# Patient Record
Sex: Female | Born: 1985 | Hispanic: Yes | Smoking: Never smoker
Health system: Southern US, Community
[De-identification: ages and names within clinical notes are randomized; demographics above are authoritative.]

## PROBLEM LIST (undated history)

## (undated) DIAGNOSIS — Z789 Other specified health status: Secondary | ICD-10-CM

## (undated) HISTORY — PX: NO PAST SURGERIES: SHX2092

---

## 2018-03-02 LAB — OB RESULTS CONSOLE ABO/RH: RH Type: POSITIVE

## 2018-03-02 LAB — OB RESULTS CONSOLE RUBELLA ANTIBODY, IGM: Rubella: IMMUNE

## 2018-03-02 LAB — OB RESULTS CONSOLE HEPATITIS B SURFACE ANTIGEN: Hepatitis B Surface Ag: NEGATIVE

## 2018-03-02 LAB — OB RESULTS CONSOLE GC/CHLAMYDIA
Chlamydia: NEGATIVE
GC PROBE AMP, GENITAL: NEGATIVE

## 2018-03-02 LAB — OB RESULTS CONSOLE ANTIBODY SCREEN: Antibody Screen: NEGATIVE

## 2018-03-02 LAB — OB RESULTS CONSOLE HIV ANTIBODY (ROUTINE TESTING): HIV: NONREACTIVE

## 2018-03-02 LAB — OB RESULTS CONSOLE RPR: RPR: NONREACTIVE

## 2018-03-11 ENCOUNTER — Other Ambulatory Visit (HOSPITAL_COMMUNITY): Payer: Self-pay | Admitting: Nurse Practitioner

## 2018-03-11 DIAGNOSIS — Z369 Encounter for antenatal screening, unspecified: Secondary | ICD-10-CM

## 2018-03-13 ENCOUNTER — Encounter (HOSPITAL_COMMUNITY): Payer: Self-pay

## 2018-03-19 ENCOUNTER — Encounter (HOSPITAL_COMMUNITY): Payer: Self-pay | Admitting: *Deleted

## 2018-03-20 ENCOUNTER — Other Ambulatory Visit (HOSPITAL_COMMUNITY): Payer: Self-pay | Admitting: *Deleted

## 2018-03-20 ENCOUNTER — Ambulatory Visit (HOSPITAL_COMMUNITY)
Admission: RE | Admit: 2018-03-20 | Discharge: 2018-03-20 | Disposition: A | Discharge status: Home / Self Care | Payer: 59 | Source: Ambulatory Visit | Attending: Nurse Practitioner | Admitting: Nurse Practitioner

## 2018-03-20 ENCOUNTER — Ambulatory Visit (HOSPITAL_COMMUNITY): Admission: RE | Admit: 2018-03-20 | Payer: 59 | Source: Ambulatory Visit

## 2018-03-20 ENCOUNTER — Encounter (HOSPITAL_COMMUNITY): Payer: Self-pay

## 2018-03-20 ENCOUNTER — Other Ambulatory Visit (HOSPITAL_COMMUNITY): Payer: Self-pay | Admitting: Nurse Practitioner

## 2018-03-20 DIAGNOSIS — Z369 Encounter for antenatal screening, unspecified: Secondary | ICD-10-CM

## 2018-03-20 DIAGNOSIS — Z363 Encounter for antenatal screening for malformations: Secondary | ICD-10-CM | POA: Diagnosis not present

## 2018-03-20 DIAGNOSIS — Z362 Encounter for other antenatal screening follow-up: Secondary | ICD-10-CM

## 2018-03-20 DIAGNOSIS — Z3A14 14 weeks gestation of pregnancy: Secondary | ICD-10-CM | POA: Insufficient documentation

## 2018-03-20 DIAGNOSIS — Z3682 Encounter for antenatal screening for nuchal translucency: Secondary | ICD-10-CM | POA: Diagnosis present

## 2018-03-20 HISTORY — DX: Other specified health status: Z78.9

## 2018-05-01 ENCOUNTER — Encounter (HOSPITAL_COMMUNITY): Payer: Self-pay

## 2018-05-01 ENCOUNTER — Ambulatory Visit (HOSPITAL_COMMUNITY): Payer: Medicaid Other

## 2018-08-21 LAB — OB RESULTS CONSOLE GBS: STREP GROUP B AG: NEGATIVE

## 2018-08-21 LAB — OB RESULTS CONSOLE GC/CHLAMYDIA
CHLAMYDIA, DNA PROBE: NEGATIVE
GC PROBE AMP, GENITAL: NEGATIVE

## 2018-09-09 NOTE — L&D Delivery Note (Addendum)
Patient: Leah Molina MRN: [id number]  GBS status: GBS negative   Patient is a 33 y.o. now [id number] s/p NSVD at [redacted]w[redacted]d, who was admitted for IOL for post dates. AROM 1h 2m prior to delivery with clear fluid.    Delivery Note At 3:41 AM a viable female was delivered via Vaginal, Spontaneous (Presentation:LOA ;).  APGAR:8/9; weight pending   .   Placenta status: intact, spontaneous, .  Cord: 3 vessel   Anesthesia:  Epidural  Episiotomy: None Lacerations:  None  Suture Repair: n/a Est. Blood Loss (mL):   Mom to postpartum.  Baby to Couplet care / Skin to Skin.  Sigurd Sos Deloglos 09/21/2018, 3:56 AM   Head delivered LOA. No nuchal cord present. Shoulder and body delivered in usual fashion. Infant with spontaneous cry, placed on mother's abdomen, dried and bulb suctioned. Cord clamped x 2 after 1-minute delay, and cut by family member. Cord blood drawn. Placenta delivered spontaneously with gentle cord traction. Fundus firm with massage and Pitocin. Perineum inspected and found to be intact.   Stephenia Deloglos   OB FELLOW DELIVERY ATTESTATION  I was gloved and present for the delivery in its entirety, and I agree with the above resident's note.    Gwenevere Abbot, MD  OB Fellow  09/21/2018, 04:28 AM

## 2018-09-14 ENCOUNTER — Telehealth (HOSPITAL_COMMUNITY): Payer: Self-pay | Admitting: *Deleted

## 2018-09-14 ENCOUNTER — Other Ambulatory Visit: Payer: Self-pay | Admitting: Certified Nurse Midwife

## 2018-09-14 NOTE — Telephone Encounter (Signed)
Preadmission screen  

## 2018-09-16 ENCOUNTER — Other Ambulatory Visit: Payer: Self-pay | Admitting: Advanced Practice Midwife

## 2018-09-20 ENCOUNTER — Inpatient Hospital Stay (HOSPITAL_COMMUNITY): Payer: 59 | Admitting: Anesthesiology

## 2018-09-20 ENCOUNTER — Inpatient Hospital Stay (HOSPITAL_COMMUNITY)
Admission: RE | Admit: 2018-09-20 | Discharge: 2018-09-22 | DRG: 807 | Disposition: A | Discharge status: Home / Self Care | Payer: 59 | Attending: Obstetrics & Gynecology | Admitting: Obstetrics & Gynecology

## 2018-09-20 ENCOUNTER — Encounter (HOSPITAL_COMMUNITY): Payer: Self-pay

## 2018-09-20 VITALS — BP 101/67 | HR 75 | Temp 97.8°F | Resp 16 | Ht 67.0 in | Wt 224.6 lb

## 2018-09-20 DIAGNOSIS — O480 Post-term pregnancy: Secondary | ICD-10-CM | POA: Diagnosis present

## 2018-09-20 DIAGNOSIS — O26893 Other specified pregnancy related conditions, third trimester: Secondary | ICD-10-CM | POA: Diagnosis present

## 2018-09-20 DIAGNOSIS — Z6791 Unspecified blood type, Rh negative: Secondary | ICD-10-CM

## 2018-09-20 DIAGNOSIS — Z3A40 40 weeks gestation of pregnancy: Secondary | ICD-10-CM | POA: Diagnosis not present

## 2018-09-20 DIAGNOSIS — Z3A41 41 weeks gestation of pregnancy: Secondary | ICD-10-CM

## 2018-09-20 DIAGNOSIS — Z3490 Encounter for supervision of normal pregnancy, unspecified, unspecified trimester: Secondary | ICD-10-CM | POA: Diagnosis present

## 2018-09-20 LAB — CBC
HCT: 41.7 % (ref 36.0–46.0)
Hemoglobin: 14.1 g/dL (ref 12.0–15.0)
MCH: 32 pg (ref 26.0–34.0)
MCHC: 33.8 g/dL (ref 30.0–36.0)
MCV: 94.8 fL (ref 80.0–100.0)
NRBC: 0 % (ref 0.0–0.2)
Platelets: 162 10*3/uL (ref 150–400)
RBC: 4.4 MIL/uL (ref 3.87–5.11)
RDW: 12.9 % (ref 11.5–15.5)
WBC: 8.2 10*3/uL (ref 4.0–10.5)

## 2018-09-20 LAB — TYPE AND SCREEN
ABO/RH(D): O POS
Antibody Screen: NEGATIVE

## 2018-09-20 LAB — ABO/RH: ABO/RH(D): O POS

## 2018-09-20 MED ORDER — OXYCODONE-ACETAMINOPHEN 5-325 MG PO TABS: 2.0000 | ORAL_TABLET | ORAL | Status: DC | PRN

## 2018-09-20 MED ORDER — FENTANYL CITRATE (PF) 100 MCG/2ML IJ SOLN: 100.0000 ug | INTRAMUSCULAR | Status: DC | PRN

## 2018-09-20 MED ORDER — MISOPROSTOL 50MCG HALF TABLET
50.0000 ug | ORAL_TABLET | ORAL | Status: DC | PRN
  Administered 2018-09-20: 50 ug via ORAL
  Filled 2018-09-20 (×2): qty 1

## 2018-09-20 MED ORDER — MISOPROSTOL 25 MCG QUARTER TABLET
25.0000 ug | ORAL_TABLET | Freq: Once | ORAL | Status: AC
  Administered 2018-09-20: 25 ug via VAGINAL
  Filled 2018-09-20: qty 1

## 2018-09-20 MED ORDER — EPHEDRINE 5 MG/ML INJ
10.0000 mg | INTRAVENOUS | Status: DC | PRN
  Filled 2018-09-20: qty 2

## 2018-09-20 MED ORDER — ZOLPIDEM TARTRATE 5 MG PO TABS: 5.0000 mg | ORAL_TABLET | Freq: Every evening | ORAL | Status: DC | PRN

## 2018-09-20 MED ORDER — LACTATED RINGERS IV SOLN: 500.0000 mL | Freq: Once | INTRAVENOUS | Status: DC

## 2018-09-20 MED ORDER — ACETAMINOPHEN 325 MG PO TABS: 650.0000 mg | ORAL_TABLET | ORAL | Status: DC | PRN

## 2018-09-20 MED ORDER — PHENYLEPHRINE 40 MCG/ML (10ML) SYRINGE FOR IV PUSH (FOR BLOOD PRESSURE SUPPORT)
80.0000 ug | PREFILLED_SYRINGE | INTRAVENOUS | Status: DC | PRN
  Filled 2018-09-20 (×2): qty 10

## 2018-09-20 MED ORDER — LACTATED RINGERS IV SOLN: 500.0000 mL | INTRAVENOUS | Status: DC | PRN

## 2018-09-20 MED ORDER — SOD CITRATE-CITRIC ACID 500-334 MG/5ML PO SOLN: 30.0000 mL | ORAL | Status: DC | PRN

## 2018-09-20 MED ORDER — MISOPROSTOL 25 MCG QUARTER TABLET
25.0000 ug | ORAL_TABLET | ORAL | Status: DC
  Administered 2018-09-20: 25 ug via VAGINAL
  Filled 2018-09-20 (×4): qty 1

## 2018-09-20 MED ORDER — LIDOCAINE HCL (PF) 1 % IJ SOLN
30.0000 mL | INTRAMUSCULAR | Status: DC | PRN
  Filled 2018-09-20: qty 30

## 2018-09-20 MED ORDER — TERBUTALINE SULFATE 1 MG/ML IJ SOLN
0.2500 mg | Freq: Once | INTRAMUSCULAR | Status: DC | PRN
  Filled 2018-09-20: qty 1

## 2018-09-20 MED ORDER — ONDANSETRON HCL 4 MG/2ML IJ SOLN: 4.0000 mg | Freq: Four times a day (QID) | INTRAMUSCULAR | Status: DC | PRN

## 2018-09-20 MED ORDER — FENTANYL 2.5 MCG/ML BUPIVACAINE 1/10 % EPIDURAL INFUSION (WH - ANES)
14.0000 mL/h | INTRAMUSCULAR | Status: DC | PRN
  Administered 2018-09-20: 14 mL/h via EPIDURAL
  Filled 2018-09-20: qty 100

## 2018-09-20 MED ORDER — OXYTOCIN BOLUS FROM INFUSION
500.0000 mL | Freq: Once | INTRAVENOUS | Status: AC
  Administered 2018-09-21: 500 mL via INTRAVENOUS

## 2018-09-20 MED ORDER — LACTATED RINGERS IV SOLN
INTRAVENOUS | Status: DC
  Administered 2018-09-20 – 2018-09-21 (×4): via INTRAVENOUS

## 2018-09-20 MED ORDER — PHENYLEPHRINE 40 MCG/ML (10ML) SYRINGE FOR IV PUSH (FOR BLOOD PRESSURE SUPPORT)
80.0000 ug | PREFILLED_SYRINGE | INTRAVENOUS | Status: DC | PRN
  Filled 2018-09-20: qty 10

## 2018-09-20 MED ORDER — DIPHENHYDRAMINE HCL 50 MG/ML IJ SOLN: 12.5000 mg | INTRAMUSCULAR | Status: DC | PRN

## 2018-09-20 MED ORDER — OXYCODONE-ACETAMINOPHEN 5-325 MG PO TABS: 1.0000 | ORAL_TABLET | ORAL | Status: DC | PRN

## 2018-09-20 MED ORDER — OXYTOCIN 40 UNITS IN NORMAL SALINE INFUSION - SIMPLE MED
2.5000 [IU]/h | INTRAVENOUS | Status: DC
  Filled 2018-09-20: qty 1000

## 2018-09-20 MED ORDER — MISOPROSTOL 50MCG HALF TABLET
50.0000 ug | ORAL_TABLET | ORAL | Status: DC | PRN
  Administered 2018-09-20 (×2): 50 ug via BUCCAL
  Filled 2018-09-20 (×2): qty 1

## 2018-09-20 NOTE — Progress Notes (Signed)
Patient ID: Leah Molina, female   DOB: 30-Dec-1985, 33 y.o.   MRN: [id number] Leah Molina is a 33 y.o. [id number] at 6765w0d.  Subjective: Mild, rare cramping   Objective: Patient Vitals for t 09/20/18 1006 117/67 - - 79 16 - -  09/20/18 0901 (!) 100/59 - - 83 18 - -     FHT:  FHR: 135 bpm, variability: mod,  accelerations:  15x15,  decelerations:  none UC:   Q 1-5 minutes, mild Dilation: 3 Effacement (%): 40 Cervical Position: Middle Station: -3 Presentation: Vertex Exam by:: Candida PeelingKaren Haynes, RN  Labs: Results for orders placed or performed during the hospital encounter of 09/20/18 (from the past 24 hour(s))  CBC     Status: None   Collection Time: 09/20/18 12:37 AM  Result Value Ref Range   WBC 8.2 4.0 - 10.5 K/uL   RBC 4.40 3.87 - 5.11 MIL/uL   Hemoglobin 14.1 12.0 - 15.0 g/dL   HCT 13.041.7 86.536.0 - 78.446.0 %   MCV 94.8 80.0 - 100.0 fL   MCH 32.0 26.0 - 34.0 pg   MCHC 33.8 30.0 - 36.0 g/dL   RDW 69.612.9 29.511.5 - 28.415.5 %   Platelets 162 150 - 400 K/uL   nRBC 0.0 0.0 - 0.2 %  Type and screen     Status: None   Collection Time: 09/20/18 12:37 AM  Result Value Ref Range   ABO/RH(D) O POS    Antibody Screen NEG    Sample Expiration      09/23/2018 Performed at [facility], [address] [place], [place] [address]   ABO/Rh     Status: None   Collection Time: 09/20/18 12:37 AM  Result Value Ref Range   ABO/RH(D)      O POS Performed at [facility], [address] [place], [place] [address]     Assessment / Plan: 2865w0d week IUP Labor: OIL Fetal Wellbeing:  Category I Pain Control:  None Anticipated MOD:  SVD Repeat Cytotec  Katrinka BlazingSmith, [place], CNM 09/20/2018 11:31 AM

## 2018-09-20 NOTE — Anesthesia Pain Management Evaluation Note (Signed)
  CRNA Pain Management Visit Note  Patient: Leah Molina, 33 y.o., female  "Hello I am a member of the anesthesia team at [facility]. We have an anesthesia team available at all times to provide care throughout the hospital, including epidural management and anesthesia for C-section. I don't know your plan for the delivery whether it a natural birth, water birth, IV sedation, nitrous supplementation, doula or epidural, but we want to meet your pain goals."   1.Was your pain managed to your expectations on prior hospitalizations?   Yes   2.What is your expectation for pain management during this hospitalization?     Labor support without medications  3.How can we help you reach that goal? Be available for emergency.  Record the patient's initial score and the patient's pain goal.   Pain: 1  Pain Goal: 10 [facility] wants you to be able to say your pain was always managed very well.  Daneya Hartgrove 09/20/2018

## 2018-09-20 NOTE — H&P (Addendum)
LABOR AND DELIVERY ADMISSION HISTORY AND PHYSICAL NOTE  Leah Molina is a 33 y.o. female [phone] with IUP at [redacted]w[redacted]d by 14w [place] presenting for IOL for postdates.   She reports positive fetal movement. She denies leakage of fluid or vaginal bleeding.  Prenatal History/Complications: [facility] Pregnancy complications:   - None  Past Medical History: Past Medical History:  Diagnosis Date  . Medical history non-contributory     Past Surgical History: Past Surgical History:  Procedure Laterality Date  . NO PAST SURGERIES      Obstetrical History: OB History    Gravida  4   Para  2   Term  2   Preterm  0   AB  1   Living  2     SAB      TAB  1   Ectopic      Multiple      Live Births              Social History: Social History   Socioeconomic History  . Marital status: Significant Other    Spouse name: CJ  . Number of children: 2  . Years of education: Not on file  . Highest education level: Not on file  Occupational History  . Not on file  Social Needs  . Financial resource strain: Not on file  . Food insecurity:    Worry: Not on file    Inability: Not on file  . Transportation needs:    Medical: Not on file    Non-medical: Not on file  Tobacco Use  . Smoking status: Never Smoker  . Smokeless tobacco: Never Used  Substance and Sexual Activity  . Alcohol use: Never    Frequency: Never  . Drug use: Never  . Sexual activity: Yes  Lifestyle  . Physical activity:    Days per week: Not on file    Minutes per session: Not on file  . Stress: Not on file  Relationships  . Social connections:    Talks on phone: Not on file    Gets together: Not on file    Attends religious service: Not on file    Active member of club or organization: Not on file    Attends meetings of clubs or organizations: Not on file    Relationship status: Not on file  Other Topics Concern  . Not on file  Social History Narrative  . Not on file     Family History: No family history on file.  Allergies: No Known Allergies  Medications Prior to Admission  Medication Sig Dispense Refill Last Dose  . Prenatal Vit-Fe Fumarate-FA (PRENATAL VITAMIN PO) Take by mouth.   Taking     Review of Systems  All systems reviewed and negative except as stated in HPI  Physical Exam Blood pressure (!) 107/59, pulse 81, temperature 98 F (36.7 C), temperature source Oral, resp. rate 16, height 5\' 7"  (1.702 m), weight 101.9 kg, last menstrual period 12/16/2017. General appearance: alert, oriented, NAD Lungs: normal respiratory effort Heart: regular rate Abdomen: soft, non-tender; gravid, FH appropriate for GA Extremities: No calf swelling or tenderness Presentation: cephalic Fetal monitoring: Baseline 145, moderate variability, accel 15x15, variable decels Uterine activity:  Dilation: 3 Effacement (%): 40 Station: -3 Exam by:: Manning Charity RN   Prenatal labs: ABO, Rh: --/--/O POS (01/12 0037) Antibody: NEG (01/12 0037) Rubella: Immune (06/24 0000) RPR: Nonreactive (06/24 0000)  HBsAg: Negative (06/24 0000)  HIV: Non-reactive (06/24 0000)  GC/Chlamydia: Negative/Negative GBS: Negative (12/13 0000)  1-hr GTT: wnl at 121 Genetic screening:  negative Anatomy US: wnl, boy  Prenatal Transfer Tool  Maternal Diabetes: No Genetic Screening: Normal Maternal Ultrasounds/Referrals: Normal Fetal Ultrasounds or other Referrals:  None Maternal Substance Abuse:  No Significant Maternal Medications:  None Significant Maternal Lab Results: Lab values include: Group B Strep negative, Rh negative  Results for orders placed or performed during the hospital encounter of 09/20/18 (from the past 24 hour(s))  CBC   Collection Time: 09/20/18 12:37 AM  Result Value Ref Range   WBC 8.2 4.0 - 10.5 K/uL   RBC 4.40 3.87 - 5.11 MIL/uL   Hemoglobin 14.1 12.0 - 15.0 g/dL   HCT 44.0 34.7 - 42.5 %   MCV 94.8 80.0 - 100.0 fL   MCH 32.0 26.0 - 34.0 pg    MCHC 33.8 30.0 - 36.0 g/dL   RDW 95.6 38.7 - 56.4 %   Platelets 162 150 - 400 K/uL   nRBC 0.0 0.0 - 0.2 %  Type and screen   Collection Time: 09/20/18 12:37 AM  Result Value Ref Range   ABO/RH(D) O POS    Antibody Screen NEG    Sample Expiration      09/23/2018 Performed at [facility], [address], [place], [place] [address]     Patient Active Problem List   Diagnosis Date Noted  . Encounter for elective induction of labor 09/20/2018    Assessment: Leah Molina is a 33 y.o. [id number] at [redacted]w[redacted]d here for IOL for postdates.  #Labor: IOL, latent. Cytotec for now. Transition to pitocin when cervix more favorable #Pain: fentanyl #FWB: Category II #ID:  GBS negative #MOF: Breast #MOC:Mirena #Circ:  None desired  Dollene Cleveland 09/20/2018, 5:33 AM  OB FELLOW HISTORY AND PHYSICAL ATTESTATION  I have seen and examined this patient; I agree with above documentation in the resident's note.   Gwenevere Abbot, MD  OB Fellow  09/20/2018, 5:46 AM

## 2018-09-20 NOTE — Progress Notes (Signed)
Patient ID: Leah Molina, female   DOB: 1986-07-13, 33 y.o.   MRN: [id number] Leah Molina is a 33 y.o. [id number] at [redacted]w[redacted]d.  Subjective: Mild, irreg cramping  Objective: BP 118/70   Pulse 83   Temp 98.3 F (36.8 C) (Oral)   Resp 18   Ht 5\' 7"  (1.702 m)   Wt 101.9 kg   LMP 12/16/2017 (Exact Date)   BMI 35.18 kg/m    FHT:  FHR: 140 bpm, variability: mod,  accelerations:  15x15,  decelerations:  none UC:   Q 1.5-4 minutes, mild Dilation: 3 Effacement (%): Thick Cervical Position: Middle Station: -3 Presentation: Vertex Exam by:: Ivonne Andrew, CNM  Labs: NA  Assessment / Plan: [redacted]w[redacted]d week IUP Labor: IOL Fetal Wellbeing:  Category I Pain Control:  None Anticipated MOD:  SVD Repeat Cytotec due to very thick cervix. Baby ballotable so unable to safely AROM.   Katrinka Blazing, [place], [place] 09/20/2018 6:34 PM

## 2018-09-20 NOTE — Progress Notes (Signed)
Leah Molina is a 33 y.o. [phone] at [redacted]w[redacted]d for postdates  Subjective: Doing well, comfortable.  Objective: BP 122/75   Pulse 84   Temp 98.5 F (36.9 C) (Oral)   Resp 16   Ht 5\' 7"  (1.702 m)   Wt 101.9 kg   LMP 12/16/2017 (Exact Date)   BMI 35.18 kg/m  No intake/output data recorded. No intake/output data recorded.  FHT:  FHR: 150 bpm, variability: moderate,  accelerations:  Present,  decelerations:  Absent UC:   irregular, every 2-4 minutes. Inadequate SVE:   Dilation: 3 Effacement (%): 60 Station: -3 Exam by:: Dr. Primitivo Gauze  Labs: Lab Results  Component Value Date   WBC 8.2 09/20/2018   HGB 14.1 09/20/2018   HCT 41.7 09/20/2018   MCV 94.8 09/20/2018   PLT 162 09/20/2018    Assessment / Plan: Cytotec x2. Cook's Foley bulb placed. Once foley bulb out will start pitocin  Labor: some progress in terms of effacement but still 3cm. Placed cook's foley bulb. Will start pitocin when FB out. Preeclampsia:  none Fetal Wellbeing:  Category I Pain Control:  epidural when necessary I/D:  n/a Anticipated MOD:  NSVD  Myrene Buddy 09/20/2018, 9:23 PM

## 2018-09-20 NOTE — Progress Notes (Signed)
Leah Molina, CNM notified pt requesting to shower. Per CNM patient may shower. Patient to notify RN when done.

## 2018-09-21 ENCOUNTER — Encounter (HOSPITAL_COMMUNITY): Payer: Self-pay

## 2018-09-21 DIAGNOSIS — O480 Post-term pregnancy: Secondary | ICD-10-CM

## 2018-09-21 DIAGNOSIS — Z3A40 40 weeks gestation of pregnancy: Secondary | ICD-10-CM

## 2018-09-21 LAB — RPR: RPR Ser Ql: NONREACTIVE

## 2018-09-21 MED ORDER — BENZOCAINE-MENTHOL 20-0.5 % EX AERO: 1.0000 "application " | INHALATION_SPRAY | CUTANEOUS | Status: DC | PRN

## 2018-09-21 MED ORDER — DIBUCAINE 1 % RE OINT
1.0000 "application " | TOPICAL_OINTMENT | RECTAL | Status: DC | PRN
  Administered 2018-09-21: 1 via RECTAL
  Filled 2018-09-21: qty 28

## 2018-09-21 MED ORDER — DIPHENHYDRAMINE HCL 25 MG PO CAPS: 25.0000 mg | ORAL_CAPSULE | Freq: Four times a day (QID) | ORAL | Status: DC | PRN

## 2018-09-21 MED ORDER — SENNOSIDES-DOCUSATE SODIUM 8.6-50 MG PO TABS
2.0000 | ORAL_TABLET | ORAL | Status: DC
  Administered 2018-09-21: 2 via ORAL
  Filled 2018-09-21: qty 2

## 2018-09-21 MED ORDER — COCONUT OIL OIL: 1.0000 "application " | TOPICAL_OIL | Status: DC | PRN

## 2018-09-21 MED ORDER — SIMETHICONE 80 MG PO CHEW: 80.0000 mg | CHEWABLE_TABLET | ORAL | Status: DC | PRN

## 2018-09-21 MED ORDER — IBUPROFEN 600 MG PO TABS
600.0000 mg | ORAL_TABLET | Freq: Four times a day (QID) | ORAL | Status: DC
  Administered 2018-09-21 – 2018-09-22 (×5): 600 mg via ORAL
  Filled 2018-09-21 (×5): qty 1

## 2018-09-21 MED ORDER — ONDANSETRON HCL 4 MG PO TABS: 4.0000 mg | ORAL_TABLET | ORAL | Status: DC | PRN

## 2018-09-21 MED ORDER — ONDANSETRON HCL 4 MG/2ML IJ SOLN: 4.0000 mg | INTRAMUSCULAR | Status: DC | PRN

## 2018-09-21 MED ORDER — LACTATED RINGERS IV SOLN: 500.0000 mL | Freq: Once | INTRAVENOUS | Status: DC

## 2018-09-21 MED ORDER — PRENATAL MULTIVITAMIN CH
1.0000 | ORAL_TABLET | Freq: Every day | ORAL | Status: DC
  Administered 2018-09-21: 1 via ORAL
  Filled 2018-09-21: qty 1

## 2018-09-21 MED ORDER — ACETAMINOPHEN 325 MG PO TABS: 650.0000 mg | ORAL_TABLET | ORAL | Status: DC | PRN

## 2018-09-21 MED ORDER — TETANUS-DIPHTH-ACELL PERTUSSIS 5-2.5-18.5 LF-MCG/0.5 IM SUSP: 0.5000 mL | Freq: Once | INTRAMUSCULAR | Status: DC

## 2018-09-21 MED ORDER — WITCH HAZEL-GLYCERIN EX PADS
1.0000 "application " | MEDICATED_PAD | CUTANEOUS | Status: DC | PRN
  Administered 2018-09-21: 1 via TOPICAL

## 2018-09-21 MED ORDER — LIDOCAINE HCL (PF) 1 % IJ SOLN
INTRAMUSCULAR | Status: DC | PRN
  Administered 2018-09-20 (×2): 4 mL via EPIDURAL

## 2018-09-21 MED ORDER — ZOLPIDEM TARTRATE 5 MG PO TABS: 5.0000 mg | ORAL_TABLET | Freq: Every evening | ORAL | Status: DC | PRN

## 2018-09-21 NOTE — Discharge Summary (Signed)
Postpartum Discharge Summary     Patient Name: Leah Molina DOB: Dec 29, 1985 MRN: [id number]  Date of admission: 09/20/2018 Delivering Provider: Gwenevere Abbot   Date of discharge: 09/22/2018  Admitting diagnosis: 40 WK INDUCTION for post dates Intrauterine pregnancy: [redacted]w[redacted]d     Secondary diagnosis:  Active Problems:   Encounter for elective induction of labor  Additional problems: none     Discharge diagnosis: Term Pregnancy Delivered                                                                                                Post partum procedures:none  Augmentation: cytotec, cervical foley,   Complications: None  Hospital course:  Induction of Labor With Vaginal Delivery   33 y.o. yo [id number] at [redacted]w[redacted]d was admitted to the hospital 09/20/2018 for induction of labor.  Indication for induction: Postdates.  Patient had an uncomplicated labor course as follows: Membrane Rupture Time/Date: 2:37 AM ,09/21/2018   Intrapartum Procedures: Episiotomy: None [1]                                         Lacerations:  None [1]  Patient had delivery of a Viable infant.  Information for the patient's newborn:  Kandice, Eberly [[id number]]  Delivery Method: Vaginal, Spontaneous(Filed from Delivery Summary)   09/21/2018  Details of delivery can be found in separate delivery note.  Patient had a routine postpartum course. Patient is discharged home 09/22/18.  Magnesium Sulfate recieved: No BMZ received: No  Physical exam  Vitals:   09/21/18 1026 09/21/18 1404 09/21/18 2158 09/22/18 0610  BP: (!) 93/59 (!) 104/59 108/64 101/67  Pulse: 87 89 93 75  Resp: 20 18  16   Temp: 99.1 F (37.3 C) 98.1 F (36.7 C) 98.6 F (37 C) 97.8 F (36.6 C)  TempSrc:   Oral Oral  SpO2:   95% 98%  Weight:      Height:       General: alert Lochia: appropriate Uterine Fundus: firm Incision: n/a DVT Evaluation: No evidence of DVT seen on physical exam. Labs: Lab Results  Component Value Date    WBC 8.2 09/20/2018   HGB 14.1 09/20/2018   HCT 41.7 09/20/2018   MCV 94.8 09/20/2018   PLT 162 09/20/2018   No flowsheet data found.  Discharge instruction: per After Visit Summary and "Baby and Me Booklet".  After visit meds:  Allergies as of 09/22/2018   No Known Allergies     Medication List    STOP taking these medications   calcium carbonate 500 MG chewable tablet Commonly known as:  TUMS - dosed in mg elemental calcium     TAKE these medications   ibuprofen 600 MG tablet Commonly known as:  ADVIL,MOTRIN Take 1 tablet (600 mg total) by mouth every 6 (six) hours.   PRENATAL VITAMIN PO Take 1 tablet by mouth daily.       Diet: routine diet  Activity: Advance as tolerated. Pelvic rest for 6 weeks.  Outpatient follow up:4 weeks Follow up Appt:No future appointments. Follow up Visit:   Please schedule this patient for Postpartum visit in: 4 weeks with the following provider: Any provider For C/S patients schedule nurse incision check in weeks 2 weeks: no Low risk pregnancy complicated by: n/a Delivery mode:  SVD Anticipated Birth Control:  IUD PP Procedures needed: n/a  Schedule Integrated BH visit: no      Newborn Data: Live born female  Birth Weight:   APGAR: 8, 8  Newborn Delivery   Birth date/time:  09/21/2018 03:41:00 Delivery type:  Vaginal, Spontaneous     Baby Feeding: Breast Disposition:home with mother   09/22/2018 Jacklyn Shell, CNM

## 2018-09-21 NOTE — Anesthesia Postprocedure Evaluation (Signed)
Anesthesia Post Note  Patient: Leah Molina  Procedure(s) Performed: AN AD HOC LABOR EPIDURAL     Patient location during evaluation: Mother Baby Anesthesia Type: Epidural Level of consciousness: awake and alert and oriented Pain management: satisfactory to patient Vital Signs Assessment: post-procedure vital signs reviewed and stable Respiratory status: respiratory function stable Cardiovascular status: stable Postop Assessment: no headache, no backache, epidural receding, patient able to bend at knees, no signs of nausea or vomiting and adequate PO intake Anesthetic complications: no    Last Vitals:  Vitals:   09/21/18 0521 09/21/18 0614  BP: (!) 103/57 132/60  Pulse: 73 67  Resp: 19 18  Temp: 37.1 C 37.1 C  SpO2: 97% 95%    Last Pain:  Vitals:   09/21/18 0300  TempSrc: Oral  PainSc:    Pain Goal:                 [name]

## 2018-09-21 NOTE — Anesthesia Procedure Notes (Signed)
Epidural Patient location during procedure: OB Start time: 09/20/2018 11:38 PM End time: 09/20/2018 11:41 PM  Staffing Anesthesiologist: Kaylyn Layer, MD Performed: anesthesiologist   Preanesthetic Checklist Completed: patient identified, pre-op evaluation, timeout performed, IV checked, risks and benefits discussed and monitors and equipment checked  Epidural Patient position: sitting Prep: site prepped and draped and DuraPrep Patient monitoring: continuous pulse ox, blood pressure, heart rate and cardiac monitor Approach: midline Location: L3-L4 Injection technique: LOR air  Needle:  Needle type: Tuohy  Needle gauge: 17 G Needle length: 9 cm Needle insertion depth: 6 cm Catheter type: closed end flexible Catheter size: 19 Gauge Catheter at skin depth: 11 cm Test dose: negative and Other (1% lidocaine)  Assessment Events: blood not aspirated, injection not painful, no injection resistance, negative IV test and no paresthesia  Additional Notes Patient identified. Risks, benefits, and alternatives discussed with patient including but not limited to bleeding, infection, nerve damage, paralysis, failed block, incomplete pain control, headache, blood pressure changes, nausea, vomiting, reactions to medication, itching, and postpartum back pain. Confirmed with bedside nurse the patient's most recent platelet count. Confirmed with patient that they are not currently taking any anticoagulation, have any bleeding history, or any family history of bleeding disorders. Patient expressed understanding and wished to proceed. All questions were answered. Sterile technique was used throughout the entire procedure. Crisp LOR after one needle redirection. Please see nursing notes for vital signs. Test dose was given through epidural catheter and negative prior to continuing to dose epidural or start infusion. Warning signs of high block given to the patient including shortness of breath,  tingling/numbness in hands, complete motor block, or any concerning symptoms with instructions to call for help. Patient was given instructions on fall risk and not to get out of bed. All questions and concerns addressed with instructions to call with any issues or inadequate analgesia.  Reason for block:procedure for pain

## 2018-09-21 NOTE — Progress Notes (Addendum)
LABOR PROGRESS NOTE  Leah Molina is a 33 y.o. [phone] at [redacted]w[redacted]d  admitted for IOL for post dates.   Subjective: Doing well, comfortable s/p epidural   Objective: BP 134/72   Pulse 82   Temp 97.8 F (36.6 C) (Axillary)   Resp 16   Ht 5\' 7"  (1.702 m)   Wt 101.9 kg   LMP 12/16/2017 (Exact Date)   SpO2 100%   BMI 35.18 kg/m  or  Vitals:   09/21/18 0030 09/21/18 0100 09/21/18 0130 09/21/18 0200  BP: 116/70 115/72 122/68 134/72  Pulse: 68 79 71 82  Resp:  18  16  Temp:      TempSrc:      SpO2: 98% 97% 99% 100%  Weight:      Height:        0245 SVE: Dilation: 7 Effacement (%): 60 Cervical Position: Middle Station: 0 Presentation: Vertex Exam by:: Steph   AROM 0237: clear, small fluid  FHT: baseline rate 135, moderate varibility, no acel, variable/early decel Toco: every 2 minutes, lasting 50-60 seconds  Labs: Lab Results  Component Value Date   WBC 8.2 09/20/2018   HGB 14.1 09/20/2018   HCT 41.7 09/20/2018   MCV 94.8 09/20/2018   PLT 162 09/20/2018    Patient Active Problem List   Diagnosis Date Noted  . Encounter for elective induction of labor 09/20/2018    Assessment / Plan: 33 y.o. [id number] at 102w1d here for IOL for post dates  GBS negative  Labor: progressing as expected, discussed risks/benefits of AROM, AROM'd patient to facilitate delivery.   AROM: clear fluid, small amount, malodorous  Fetal Wellbeing:  Category 2, continue position changes, IV bolus started Pain Control:  Epidural  Anticipated MOD:  SVD  Stephenia Deloglos, SNM  09/21/2018, 2:44 AM   OB FELLOW LABOR PROGRESS NOTE ATTESTATION  I have seen and examined this patient and agree with above documentation in the resident's note.   Gwenevere Abbot, MD  OB Fellow  09/21/2018, 3:29 AM

## 2018-09-21 NOTE — Anesthesia Preprocedure Evaluation (Signed)
Anesthesia Evaluation  Patient identified by MRN, date of birth, ID band Patient awake    Reviewed: Allergy & Precautions, Patient's Chart, lab work & pertinent test results  History of Anesthesia Complications Negative for: history of anesthetic complications  Airway Mallampati: II  TM Distance: >3 FB Neck ROM: Full    Dental  (+) Teeth Intact   Pulmonary neg pulmonary ROS,    Pulmonary exam normal breath sounds clear to auscultation       Cardiovascular negative cardio ROS Normal cardiovascular exam Rhythm:Regular Rate:Normal     Neuro/Psych negative neurological ROS  negative psych ROS   GI/Hepatic negative GI ROS, Neg liver ROS,   Endo/Other  negative endocrine ROS  Renal/GU negative Renal ROS  negative genitourinary   Musculoskeletal negative musculoskeletal ROS (+)   Abdominal   Peds negative pediatric ROS (+)  Hematology negative hematology ROS (+)   Anesthesia Other Findings   Reproductive/Obstetrics (+) Pregnancy                             Anesthesia Physical Anesthesia Plan  ASA: II  Anesthesia Plan: Epidural   Post-op Pain Management:    Induction:   PONV Risk Score and Plan: Treatment may vary due to age or medical condition  Airway Management Planned: Natural Airway  Additional Equipment:   Intra-op Plan:   Post-operative Plan:   Informed Consent: I have reviewed the patients History and Physical, chart, labs and discussed the procedure including the risks, benefits and alternatives for the proposed anesthesia with the patient or authorized representative who has indicated his/her understanding and acceptance.       Plan Discussed with: CRNA  Anesthesia Plan Comments:         Anesthesia Quick Evaluation  

## 2018-09-22 ENCOUNTER — Encounter (HOSPITAL_COMMUNITY): Payer: Self-pay

## 2018-09-22 MED ORDER — IBUPROFEN 600 MG PO TABS: 600.0000 mg | ORAL_TABLET | Freq: Four times a day (QID) | ORAL | 0 refills | Status: DC

## 2018-09-22 NOTE — Lactation Note (Signed)
This note was copied from a baby's chart. Lactation Consultation Note  Patient Name: Leah Molina DGLOV'F Date: 09/22/2018 Reason for consult: Initial assessment;Term P3, 22 hour female infant. Per parents, infant has one void and one stool since delivery. Per mom, active on [facility] program in [place]. Per mom, she doesn't have a breast pump at home, [facility] gave mom harmony hand pump explained how to clean, assemble and re-assemble breast pump.  Per mom, she breastfeed and formula feed her previous son for 4 months but stopped due to returning to work.  LC entered room mom was towards the end of breastfeeding infant, per mom infant had been breastfeeding for 25 minutes. Mom latched infant on right breast using the  cross cradle hold, swallows observed infant deep latch.  Mom demonstrated hand expression and infant was given 2 ml of EBM on spoon. LC discussed cluster feeding with parents. Mom knows to breastfeed infant according hunger cues and not exceed 3 hours without breastfeeding infant. LC discussed I&O. Reviewed Baby & Me book's Breastfeeding Basics. Mom made aware of O/P services, breastfeeding support groups, community resources, and our phone # for post-discharge questions.     Maternal Data Formula Feeding for Exclusion: No Has patient been taught Hand Expression?: Yes(Infant given 9ml of colostrum by spoon.) Does the patient have breastfeeding experience prior to this delivery?: Yes  Feeding Feeding Type: Breast Fed  LATCH Score Latch: Grasps breast easily, tongue down, lips flanged, rhythmical sucking.  Audible Swallowing: Spontaneous and intermittent  Type of Nipple: Everted at rest and after stimulation  Comfort (Breast/Nipple): Soft / non-tender  Hold (Positioning): No assistance needed to correctly position infant at breast.  LATCH Score: 10  Interventions Interventions: Breast feeding basics reviewed;Hand express;Breast compression;Hand pump  Lactation  Tools Discussed/Used WIC Program: Yes Pump Review: Setup, frequency, and cleaning;Milk Storage Initiated by:: Danelle Earthly, IBCLC Date initiated:: 09/22/18   Consult Status Consult Status: Follow-up Date: 09/23/18 Follow-up type: In-patient    Danelle Earthly 09/22/2018, 1:48 AM

## 2018-09-22 NOTE — Lactation Note (Signed)
This note was copied from a baby's chart. Lactation Consultation Note  Patient Name: Boy Taquasha Torney LKGMW'N Date: 09/22/2018 Reason for consult: Follow-up assessment Mom reports that baby is latching with ease and she is comfortable.  Discussed milk coming to volume and the prevention and treatment of engorgement.  She has a manual pump for home use.  Lactation outpatient services and support information reviewed and encouraged.  Maternal Data    Feeding Feeding Type: Breast Fed  LATCH Score Latch: Grasps breast easily, tongue down, lips flanged, rhythmical sucking.  Audible Swallowing: Spontaneous and intermittent  Type of Nipple: Everted at rest and after stimulation  Comfort (Breast/Nipple): Soft / non-tender  Hold (Positioning): Assistance needed to correctly position infant at breast and maintain latch.  LATCH Score: 9  Interventions    Lactation Tools Discussed/Used     Consult Status Consult Status: Complete Follow-up type: Call as needed    Huston Foley 09/22/2018, 10:34 AM

## 2018-09-22 NOTE — Discharge Instructions (Signed)

## 2019-09-10 NOTE — L&D Delivery Note (Signed)
OB/GYN Faculty Practice Delivery Note  Leah Molina is a 34 y.o. [id number] s/p SVDat [redacted]w[redacted]d. She was admitted for IOL for PD.   ROM: rupture date, rupture time, delivery date, or delivery time have not been documented with clear fluid GBS Status: Negative/-- (09/30 1533) Maximum Maternal Temperature: n/a  Labor Progress: . Initial SVE: 2/50/-2. She received cytotec x1 and then was transitioned to pitocin. SROM at 9cm. She then progressed to complete.   Delivery Date/Time: 10/31 at 1920  Delivery: Called to room and patient was complete and pushing.  Slow crown noted. Head delivered LOA. Shoulder dystocia identified. Patient placed in [place], suprapubic pressure applied. Rotational maneuvers attempted without movement. Posterior shoulder (left) removed and successfully relieved dystocia.  Loose nuchal cord present. Body delivered in usual fashion. Infant without spontaneous cry, cord clamped and cut by MD and baby handed over to neonatal team. Total shoulder dystocia time 60 seconds. Cord blood drawn. Placenta delivered spontaneously with gentle cord traction. Fundus firm with massage and Pitocin. Labia, perineum, vagina, and cervix inspected inspected with no lacerations. After neonatal team inspection of newborn, she was able to be handed back to mother. Mom and baby stable.    Baby Weight: pending  Placenta: Sent to L&D Complications: sixty second shoulder dystocia Lacerations: none EBL: 100 mL Analgesia: Epidural   Infant:  APGAR (1 MIN):  Not documented at time of note writing  APGAR (5 MINS): 9   APGAR (10 MINS):     Casper Harrison, MD [facility] Family Medicine Fellow, [facility] for [facility], [facility] 07/09/2020, 7:49 PM

## 2019-12-14 ENCOUNTER — Encounter: Payer: Self-pay | Admitting: General Practice

## 2019-12-27 ENCOUNTER — Other Ambulatory Visit: Payer: Self-pay

## 2019-12-27 ENCOUNTER — Ambulatory Visit (INDEPENDENT_AMBULATORY_CARE_PROVIDER_SITE_OTHER): Payer: Medicaid Other | Admitting: *Deleted

## 2019-12-27 VITALS — BP 109/72 | HR 79 | Temp 98.1°F | Wt 212.4 lb

## 2019-12-27 DIAGNOSIS — Z3480 Encounter for supervision of other normal pregnancy, unspecified trimester: Secondary | ICD-10-CM

## 2019-12-27 MED ORDER — VITAFOL GUMMIES 3.33-0.333-34.8 MG PO CHEW: 3.0000 | CHEWABLE_TABLET | Freq: Every day | ORAL | 12 refills | Status: DC

## 2019-12-27 MED ORDER — GOJJI WEIGHT SCALE MISC: 1.0000 | Freq: Every day | 0 refills | Status: DC | PRN

## 2019-12-27 MED ORDER — BLOOD PRESSURE MONITOR AUTOMAT DEVI: 1.0000 | Freq: Every day | 0 refills | Status: DC

## 2019-12-27 NOTE — Patient Instructions (Addendum)
 Genetic Screening Results Information: You are having genetic testing called [facility] today.  It will take approximately 2 weeks before the results are available.  To get your results, you need Internet access to a web browser to search [facility]/MyChart (the direct app on your phone will not give you these results).  Then select Lab Scanned and click on the blue hyper link that says View Image to see your Panorama results.  You can also use the directions on the purple card given to look up your results directly on the [employer] website.   Second Trimester of Pregnancy  The second trimester is from week 14 through week 27 (month 4 through 6). This is often the time in pregnancy that you feel your best. Often times, morning sickness has lessened or quit. You may have more energy, and you may get hungry more often. Your unborn baby is growing rapidly. At the end of the sixth month, he or she is about 9 inches long and weighs about 1 pounds. You will likely feel the baby move between 18 and 20 weeks of pregnancy. Follow these instructions at home: Medicines  Take over-the-counter and prescription medicines only as told by your doctor. Some medicines are safe and some medicines are not safe during pregnancy.  Take a prenatal vitamin that contains at least 600 micrograms (mcg) of folic acid.  If you have trouble pooping (constipation), take medicine that will make your stool soft (stool softener) if your doctor approves. Eating and drinking   Eat regular, healthy meals.  Avoid raw meat and uncooked cheese.  If you get low calcium from the food you eat, talk to your doctor about taking a daily calcium supplement.  Avoid foods that are high in fat and sugars, such as fried and sweet foods.  If you feel sick to your stomach (nauseous) or throw up (vomit): ? Eat 4 or 5 small meals a day instead of 3 large meals. ? Try eating a few soda crackers. ? Drink liquids between meals instead of during  meals.  To prevent constipation: ? Eat foods that are high in fiber, like fresh fruits and vegetables, whole grains, and beans. ? Drink enough fluids to keep your pee (urine) clear or pale yellow. Activity  Exercise only as told by your doctor. Stop exercising if you start to have cramps.  Do not exercise if it is too hot, too humid, or if you are in a place of great height (high altitude).  Avoid heavy lifting.  Wear low-heeled shoes. Sit and stand up straight.  You can continue to have sex unless your doctor tells you not to. Relieving pain and discomfort  Wear a good support bra if your breasts are tender.  Take warm water baths (sitz baths) to soothe pain or discomfort caused by hemorrhoids. Use hemorrhoid cream if your doctor approves.  Rest with your legs raised if you have leg cramps or low back pain.  If you develop puffy, bulging veins (varicose veins) in your legs: ? Wear support hose or compression stockings as told by your doctor. ? Raise (elevate) your feet for 15 minutes, 3-4 times a day. ? Limit salt in your food. Prenatal care  Write down your questions. Take them to your prenatal visits.  Keep all your prenatal visits as told by your doctor. This is important. Safety  Wear your seat belt when driving.  Make a list of emergency phone numbers, including numbers for family, friends, the hospital, and police and   fire departments. General instructions  Ask your doctor about the right foods to eat or for help finding a counselor, if you need these services.  Ask your doctor about local prenatal classes. Begin classes before month 6 of your pregnancy.  Do not use hot tubs, steam rooms, or saunas.  Do not douche or use tampons or scented sanitary pads.  Do not cross your legs for long periods of time.  Visit your dentist if you have not done so. Use a soft toothbrush to brush your teeth. Floss gently.  Avoid all smoking, herbs, and alcohol. Avoid drugs  that are not approved by your doctor.  Do not use any products that contain nicotine or tobacco, such as cigarettes and e-cigarettes. If you need help quitting, ask your doctor.  Avoid cat litter boxes and soil used by cats. These carry germs that can cause birth defects in the baby and can cause a loss of your baby (miscarriage) or stillbirth. Contact a doctor if:  You have mild cramps or pressure in your lower belly.  You have pain when you pee (urinate).  You have bad smelling fluid coming from your vagina.  You continue to feel sick to your stomach (nauseous), throw up (vomit), or have watery poop (diarrhea).  You have a nagging pain in your belly area.  You feel dizzy. Get help right away if:  You have a fever.  You are leaking fluid from your vagina.  You have spotting or bleeding from your vagina.  You have severe belly cramping or pain.  You lose or gain weight rapidly.  You have trouble catching your breath and have chest pain.  You notice sudden or extreme puffiness (swelling) of your face, hands, ankles, feet, or legs.  You have not felt the baby move in over an hour.  You have severe headaches that do not go away when you take medicine.  You have trouble seeing. Summary  The second trimester is from week 14 through week 27 (months 4 through 6). This is often the time in pregnancy that you feel your best.  To take care of yourself and your unborn baby, you will need to eat healthy meals, take medicines only if your doctor tells you to do so, and do activities that are safe for you and your baby.  Call your doctor if you get sick or if you notice anything unusual about your pregnancy. Also, call your doctor if you need help with the right food to eat, or if you want to know what activities are safe for you. This information is not intended to replace advice given to you by your health care provider. Make sure you discuss any questions you have with your health  care provider. Document Revised: 12/18/2018 Document Reviewed: 10/01/2016 Elsevier Patient Education  2020 Elsevier Inc.  Warning Signs During Pregnancy A pregnancy lasts about 40 weeks, starting from the first day of your last period until the baby is born. Pregnancy is divided into three phases called trimesters.  The first trimester refers to week 1 through week 13 of pregnancy.  The second trimester is the start of week 14 through the end of week 27.  The third trimester is the start of week 28 until you deliver your baby. During each trimester of pregnancy, certain signs and symptoms may indicate a problem. Talk with your health care provider about your current health and any medical conditions you have. Make sure you know the symptoms that you should watch   for and report. How does this affect me?  Warning signs in the first trimester While some changes during the first trimester may be uncomfortable, most do not represent a serious problem. Let your health care provider know if you have any of the following warning signs in the first trimester:  You cannot eat or drink without vomiting, and this lasts for longer than a day.  You have vaginal bleeding or spotting along with menstrual-like cramping.  You have diarrhea for longer than a day.  You have a fever or other signs of infection, such as: ? Pain or burning when you urinate. ? Foul smelling or thick or yellowish vaginal discharge. Warning signs in the second trimester As your baby grows and changes during the second trimester, there are additional signs and symptoms that may indicate a problem. These include:  Signs and symptoms of infection, including a fever.  Signs or symptoms of a miscarriage or preterm labor, such as regular contractions, menstrual-like cramping, or lower abdominal pain.  Bloody or watery vaginal discharge or obvious vaginal bleeding.  Feeling like your heart is pounding.  Having trouble  breathing.  Nausea, vomiting, or diarrhea that lasts for longer than a day.  Craving non-food items, such as clay, chalk, or dirt. This may be a sign of a very treatable medical condition called pica. Later in your second trimester, watch for signs and symptoms of a serious medical condition called preeclampsia.These include:  Changes in your vision.  A severe headache that does not go away.  Nausea and vomiting. It is also important to notice if your baby stops moving or moves less than usual during this time. Warning signs in the third trimester As you approach the third trimester, your baby is growing and your body is preparing for the birth of your baby. In your third trimester, be sure to let your health care provider know if:  You have signs and symptoms of infection, including a fever.  You have vaginal bleeding.  You notice that your baby is moving less than usual or is not moving.  You have nausea, vomiting, or diarrhea that lasts for longer than a day.  You have a severe headache that does not go away.  You have vision changes, including seeing spots or having blurry or double vision.  You have increased swelling in your hands or face. How does this affect my baby? Throughout your pregnancy, always report any of the warning signs of a problem to your health care provider. This can help prevent complications that may affect your baby, including:  Increased risk for premature birth.  Infection that may be transmitted to your baby.  Increased risk for stillbirth. Contact a health care provider if:  You have any of the warning signs of a problem for the current trimester of your pregnancy.  Any of the following apply to you during any trimester of pregnancy: ? You have strong emotions, such as sadness or anxiety, that interfere with work or personal relationships. ? You feel unsafe in your home and need help finding a safe place to live. ? You are using tobacco  products, alcohol, or drugs and you need help to stop. Get help right away if: You have signs or symptoms of labor before 37 weeks of pregnancy. These include:  Contractions that are 5 minutes or less apart, or that increase in frequency, intensity, or length.  Sudden, sharp abdominal pain or low back pain.  Uncontrolled gush or trickle of fluid from   your vagina. Summary  A pregnancy lasts about 40 weeks, starting from the first day of your last period until the baby is born. Pregnancy is divided into three phases called trimesters. Each trimester has warning signs to watch for.  Always report any warning signs to your health care provider in order to prevent complications that may affect both you and your baby.  Talk with your health care provider about your current health and any medical conditions you have. Make sure you know the symptoms that you should watch for and report. This information is not intended to replace advice given to you by your health care provider. Make sure you discuss any questions you have with your health care provider. Document Revised: 12/15/2018 Document Reviewed: 06/12/2017 Elsevier Patient Education  2020 Elsevier Inc.  

## 2019-12-27 NOTE — Progress Notes (Signed)
   PRENATAL INTAKE SUMMARY  [name] presents today New OB Nurse Interview.  OB History    Gravida  5   Para  3   Term  3   Preterm  0   AB  1   Living  3     SAB      TAB  1   Ectopic      Multiple  0   Live Births  3          I have reviewed the patient's medical, obstetrical, social, and family histories, medications, and available lab results.  SUBJECTIVE She has no unusual complaints.  OBJECTIVE Initial nurse interview for history and labs (New OB)  EDD: 07/02/20 by LMP GA: [redacted]w[redacted]d G5P3013 FHT: 160  GENERAL APPEARANCE: alert, well appearing, in no apparent distress, oriented to person, place and time, well hydrated   ASSESSMENT Normal pregnancy  PLAN Prenatal care-[facility]/HIV  OB Urine Culture GC/CT at next visit with Leah Molina, CNM 01/26/20 HgbEval/SMA/CF ([facility]) [facility] Rx for Prenatal gummies sent to Pharmacy Rx for BP cuff and weight scale sent to [facility] Patient to sign up for Babyscripts  Clovis Pu, RN

## 2019-12-28 ENCOUNTER — Encounter: Payer: Self-pay | Admitting: General Practice

## 2019-12-28 LAB — OBSTETRIC PANEL, INCLUDING HIV
Antibody Screen: NEGATIVE
Basophils Absolute: 0 10*3/uL (ref 0.0–0.2)
Basos: 0 %
EOS (ABSOLUTE): 0 10*3/uL (ref 0.0–0.4)
Eos: 0 %
HIV Screen 4th Generation wRfx: NONREACTIVE
Hematocrit: 39.8 % (ref 34.0–46.6)
Hemoglobin: 13.8 g/dL (ref 11.1–15.9)
Hepatitis B Surface Ag: NEGATIVE
Immature Grans (Abs): 0 10*3/uL (ref 0.0–0.1)
Immature Granulocytes: 0 %
Lymphocytes Absolute: 1.9 10*3/uL (ref 0.7–3.1)
Lymphs: 20 %
MCH: 31.9 pg (ref 26.6–33.0)
MCHC: 34.7 g/dL (ref 31.5–35.7)
MCV: 92 fL (ref 79–97)
Monocytes Absolute: 0.6 10*3/uL (ref 0.1–0.9)
Monocytes: 7 %
Neutrophils Absolute: 6.7 10*3/uL (ref 1.4–7.0)
Neutrophils: 73 %
Platelets: 235 10*3/uL (ref 150–450)
RBC: 4.32 x10E6/uL (ref 3.77–5.28)
RDW: 12.7 % (ref 11.7–15.4)
RPR Ser Ql: NONREACTIVE
Rh Factor: POSITIVE
Rubella Antibodies, IGG: 14.9 index (ref >0.99)
WBC: 9.3 10*3/uL (ref 3.4–10.8)

## 2019-12-28 LAB — HEPATITIS C ANTIBODY: Hep C Virus Ab: <0.1 {s_co_ratio} (ref 0.0–0.9)

## 2019-12-29 LAB — CULTURE, OB URINE

## 2019-12-29 LAB — URINE CULTURE, OB REFLEX

## 2020-01-03 ENCOUNTER — Encounter: Payer: Self-pay | Admitting: General Practice

## 2020-01-06 ENCOUNTER — Encounter: Payer: Self-pay | Admitting: General Practice

## 2020-01-26 ENCOUNTER — Ambulatory Visit (INDEPENDENT_AMBULATORY_CARE_PROVIDER_SITE_OTHER): Payer: Medicaid Other | Admitting: Obstetrics and Gynecology

## 2020-01-26 ENCOUNTER — Encounter: Payer: Self-pay | Admitting: General Practice

## 2020-01-26 ENCOUNTER — Other Ambulatory Visit: Payer: Self-pay

## 2020-01-26 ENCOUNTER — Encounter: Payer: Self-pay | Admitting: Obstetrics and Gynecology

## 2020-01-26 ENCOUNTER — Other Ambulatory Visit (HOSPITAL_COMMUNITY)
Admission: RE | Admit: 2020-01-26 | Discharge: 2020-01-26 | Disposition: A | Discharge status: Home / Self Care | Payer: Commercial Managed Care - HMO | Source: Ambulatory Visit | Attending: Obstetrics and Gynecology | Admitting: Obstetrics and Gynecology

## 2020-01-26 DIAGNOSIS — Z3A17 17 weeks gestation of pregnancy: Secondary | ICD-10-CM

## 2020-01-26 DIAGNOSIS — Z3480 Encounter for supervision of other normal pregnancy, unspecified trimester: Secondary | ICD-10-CM

## 2020-01-26 MED ORDER — VITAFOL GUMMIES 3.33-0.333-34.8 MG PO CHEW: 3.0000 | CHEWABLE_TABLET | Freq: Every day | ORAL | 12 refills | Status: AC

## 2020-01-26 NOTE — Patient Instructions (Signed)

## 2020-01-26 NOTE — Progress Notes (Signed)
INITIAL OBSTETRICAL VISIT Patient name: Leah Molina MRN [id number]  Date of birth: 03-12-1986 Chief Complaint:   Initial Prenatal Visit  History of Present Illness:   Leah Molina is a 34 y.o. [phone] Hispanic female at [redacted]w[redacted]d by LMP with an Estimated Date of Delivery: 07/02/20 being seen today for her initial obstetrical visit.  Her obstetrical history is significant for none. This is a planned pregnancy. She and the father of the baby (FOB) "Costella Hatcher" live together. She has a support system that consists of FOB/her mother/father/friends. Today she reports no complaints.   Patient's last menstrual period was 09/26/2019 (exact date). Last pap unknown. Review of Systems:   Pertinent items are noted in HPI Denies cramping/contractions, leakage of fluid, vaginal bleeding, abnormal vaginal discharge w/ itching/odor/irritation, headaches, visual changes, shortness of breath, chest pain, abdominal pain, severe nausea/vomiting, or problems with urination or bowel movements unless otherwise stated above.  Pertinent History Reviewed:  Reviewed past medical,surgical, social, obstetrical and family history.  Reviewed problem list, medications and allergies. OB History  Gravida Para Term Preterm AB Living  5 3 3  0 1 3  SAB TAB Ectopic Multiple Live Births    1   0 3    # Outcome Date GA Lbr Len/2nd Weight Sex Delivery Anes PTL Lv  5 Current           4 Term 09/21/18 [redacted]w[redacted]d 03:46 / 00:15 9 lb 1.9 oz (4.136 kg) M Vag-Spont EPI  LIV  3 Term 01/24/12 [redacted]w[redacted]d   M Vag-Spont  N LIV  2 Term 09/05/09 [redacted]w[redacted]d   M Vag-Spont  N LIV  1 TAB            Physical Assessment:   Vitals:   01/26/20 1436  BP: 113/69  Pulse: 96  Temp: 97.9 F (36.6 C)  Weight: 223 lb (101.2 kg)  Body mass index is 34.93 kg/m.       Physical Examination:  General appearance - well appearing, and in no distress  Mental status - alert, oriented to person, place, and time  Psych:  She has a normal mood and affect  Skin -  warm and dry, normal color, no suspicious lesions noted  Chest - effort normal, all lung fields clear to auscultation bilaterally  Heart - normal rate and regular rhythm  Abdomen - soft, nontender  Extremities:  No swelling or varicosities noted  Pelvic - VULVA: normal appearing vulva with no masses, tenderness or lesions  VAGINA: normal appearing vagina with normal color and discharge, no lesions.   CERVIX: normal appearing cervix without discharge or lesions, no CMT  Thin prep pap is done with HR HPV cotesting   Nursing Staff Provider  Office Location  Ren Dating  LMP  Language  English Anatomy US    Flu Vaccine  N/A Genetic Screen  NIPS:LR girl   AFP:   First Screen:  Quad:    TDaP vaccine    Hgb A1C or  GTT Early  Third trimester   Rhogam     LAB RESULTS     Blood Type O/Positive/-- (04/19 1402)   Feeding Plan Breast Antibody Negative (04/19 1402)  Contraception Nexplanon Rubella 14.90 (04/19 1402) IMMUNE  Circumcision Undecided RPR Non Reactive (04/19 1402)   Pediatrician  [facility] HBsAg Negative (04/19 1402)   Support Person [facility] HCVAb Negative  Prenatal Classes No HIV Non Reactive (04/19 1402)     BTL Consent N/A GBS  (For PCN allergy, check sensitivities)  VBAC Consent N/A Pap     Hgb Electro  Negative  BP Cuff Rx [facility] 12/27/19 CF Negative  Weight Scale Rx [facility] 12/27/19 SMA Negative    Waterbirth  [ ]  Class [ ]  Consent [ ]  CNM visit    Assessment & Plan:  1) Low-Risk Pregnancy at [redacted]w[redacted]d with an Estimated Date of Delivery: 07/02/20   2) Initial OB visit - Welcomed to practice and introduced self to patient in addition to discussing other advanced practice providers that she may be seeing at this practice - Congratulated patient - Anticipatory guidance on upcoming appointments - Educated on COVID19 and pregnancy and the integration of virtual appointments  - Educated on babyscripts app- patient reports she has not received email,  encouraged to look in spam folder and to call office if she still has not received email - patient verbalizes understanding   3) Supervision of other normal pregnancy, antepartum  - Rx for Prenatal Vit-Fe Phos-FA-Omega (VITAFOL GUMMIES) 3.33-0.333-34.8 MG CHEW, - Cytology - PAP( [place]),  - Cervicovaginal ancillary only( [place]), - [redacted]w[redacted]d MFM OB COMP + 14 WK   Meds:  Meds ordered this encounter  Medications  . Prenatal Vit-Fe Phos-FA-Omega (VITAFOL GUMMIES) 3.33-0.333-34.8 MG CHEW    Sig: Chew 3 each by mouth daily.    Dispense:  90 tablet    Refill:  12    Initial labs obtained Continue prenatal vitamins Reviewed n/v relief measures and warning s/s to report Reviewed recommended weight gain based on pre-gravid BMI Encouraged well-balanced diet Genetic Screening discussed: results reviewed Cystic fibrosis, SMA, Fragile X screening discussed results reviewed The nature of [place] [facility] with multiple MDs and other Advanced Practice Providers was explained to patient; also emphasized that residents, students are part of our team.  Discussed optimized OB schedule and video visits. Advised can have an in-office visit whenever she feels she needs to be seen.  Does not have own BP cuff. BP cuff Rx faxed today. Explained to patient that BP will be mailed to her house. Check BP weekly, let us know if >140/90. Advised to call during normal business hours and there is an after-hours nurse line available.   Follow-up: Return in about 4 weeks (around 02/23/2020) for Return OB - My Chart video.   Orders Placed This Encounter  Procedures  . [facility] MFM OB COMP + 14 WK    [place] MSN, 02/25/2020 01/26/2020

## 2020-01-27 LAB — CERVICOVAGINAL ANCILLARY ONLY
Bacterial Vaginitis (gardnerella): POSITIVE — AB
Candida Glabrata: NEGATIVE
Candida Vaginitis: NEGATIVE
Chlamydia: NEGATIVE
Comment: NEGATIVE
Comment: NEGATIVE
Comment: NEGATIVE
Comment: NEGATIVE
Comment: NEGATIVE
Comment: NORMAL
Neisseria Gonorrhea: NEGATIVE
Trichomonas: NEGATIVE

## 2020-01-28 ENCOUNTER — Telehealth: Payer: Self-pay | Admitting: *Deleted

## 2020-01-28 DIAGNOSIS — B9689 Other specified bacterial agents as the cause of diseases classified elsewhere: Secondary | ICD-10-CM

## 2020-01-28 MED ORDER — METRONIDAZOLE 500 MG PO TABS: 500.0000 mg | ORAL_TABLET | Freq: Two times a day (BID) | ORAL | 0 refills | Status: DC

## 2020-01-28 NOTE — Telephone Encounter (Signed)
-----   Message from [name], [place] sent at 01/27/2020 11:50 PM EDT ----- Treat for BV

## 2020-02-01 ENCOUNTER — Encounter: Payer: Self-pay | Admitting: Obstetrics and Gynecology

## 2020-02-02 ENCOUNTER — Other Ambulatory Visit (INDEPENDENT_AMBULATORY_CARE_PROVIDER_SITE_OTHER): Payer: Medicaid Other | Admitting: Obstetrics and Gynecology

## 2020-02-02 DIAGNOSIS — Z3480 Encounter for supervision of other normal pregnancy, unspecified trimester: Secondary | ICD-10-CM

## 2020-02-02 LAB — CYTOLOGY - PAP
Comment: NEGATIVE
Comment: NEGATIVE
HPV 16: NEGATIVE
HPV 18 / 45: NEGATIVE
High risk HPV: POSITIVE — AB

## 2020-02-11 ENCOUNTER — Other Ambulatory Visit: Payer: Self-pay | Admitting: *Deleted

## 2020-02-11 ENCOUNTER — Ambulatory Visit: Payer: Medicaid Other

## 2020-02-11 ENCOUNTER — Other Ambulatory Visit: Payer: Self-pay

## 2020-02-11 ENCOUNTER — Ambulatory Visit: Payer: 59 | Admitting: *Deleted

## 2020-02-11 ENCOUNTER — Ambulatory Visit: Payer: 59 | Attending: Obstetrics and Gynecology

## 2020-02-11 DIAGNOSIS — Z362 Encounter for other antenatal screening follow-up: Secondary | ICD-10-CM

## 2020-02-11 DIAGNOSIS — Z363 Encounter for antenatal screening for malformations: Secondary | ICD-10-CM | POA: Diagnosis not present

## 2020-02-11 DIAGNOSIS — Z3A19 19 weeks gestation of pregnancy: Secondary | ICD-10-CM

## 2020-02-11 DIAGNOSIS — O99212 Obesity complicating pregnancy, second trimester: Secondary | ICD-10-CM

## 2020-02-11 DIAGNOSIS — Z3480 Encounter for supervision of other normal pregnancy, unspecified trimester: Secondary | ICD-10-CM | POA: Diagnosis present

## 2020-02-11 DIAGNOSIS — E669 Obesity, unspecified: Secondary | ICD-10-CM | POA: Diagnosis not present

## 2020-02-24 ENCOUNTER — Telehealth: Payer: Self-pay | Admitting: *Deleted

## 2020-02-24 ENCOUNTER — Telehealth (INDEPENDENT_AMBULATORY_CARE_PROVIDER_SITE_OTHER): Payer: Commercial Managed Care - HMO | Admitting: Obstetrics and Gynecology

## 2020-02-24 ENCOUNTER — Encounter: Payer: Self-pay | Admitting: Obstetrics and Gynecology

## 2020-02-24 DIAGNOSIS — Z3A21 21 weeks gestation of pregnancy: Secondary | ICD-10-CM

## 2020-02-24 DIAGNOSIS — Z3482 Encounter for supervision of other normal pregnancy, second trimester: Secondary | ICD-10-CM

## 2020-02-24 DIAGNOSIS — Z3480 Encounter for supervision of other normal pregnancy, unspecified trimester: Secondary | ICD-10-CM

## 2020-02-24 NOTE — Telephone Encounter (Signed)
Left voice message for patient to call clinic to start Mychart visit.   Arling Cerone L, RN  

## 2020-02-24 NOTE — Progress Notes (Signed)
   MY CHART VIDEO VIRTUAL OBSTETRICS VISIT ENCOUNTER NOTE  I connected with Leah Molina on 02/24/20 at  3:30 PM EDT by My Chart video at home and verified that I am speaking with the correct person using two identifiers. Provider located at [facility] for [employer] at [place].   I discussed the limitations, risks, security and privacy concerns of performing an evaluation and management service by My Chart video and the availability of in person appointments. I also discussed with the patient that there may be a patient responsible charge related to this service. The patient expressed understanding and agreed to proceed.  Subjective:  Leah Molina is a 34 y.o. [phone] at [redacted]w[redacted]d being followed for ongoing prenatal care.  She is currently monitored for the following issues for this low-risk pregnancy and has Supervision of other normal pregnancy, antepartum on their problem list.  Patient reports no complaints. Reports fetal movement. Denies any contractions, bleeding or leaking of fluid.   The following portions of the patient's history were reviewed and updated as appropriate: allergies, current medications, past family history, past medical history, past social history, past surgical history and problem list.   Objective:   General:  Alert, oriented and cooperative.   Mental Status: Normal mood and affect perceived. Normal judgment and thought content.  Rest of physical exam deferred due to type of encounter  Wt 218 lb (98.9 kg)   LMP 09/26/2019 (Exact Date)   BMI 34.14 kg/m  **Done by patient's own at home scale -- does not know how to use BP cuff, because she never tried to use it.  Assessment and Plan:  Pregnancy: [id number] at [redacted]w[redacted]d  1. Supervision of other normal pregnancy, antepartum - Information provided on BP cuff use on AVS instructions. Advised to sen BP number to our office via My Chart. - Advised to call office, if unable to use BP cuff  - [place] appt with Dr.  Debroah Loop on 03/01/20 - F/U U/S on 03/10/20 - Anticipatory guidance for nv in 3 wks.  Preterm labor symptoms and general obstetric precautions including but not limited to vaginal bleeding, contractions, leaking of fluid and fetal movement were reviewed in detail with the patient.  I discussed the assessment and treatment plan with the patient. The patient was provided an opportunity to ask questions and all were answered. The patient agreed with the plan and demonstrated an understanding of the instructions. The patient was advised to call back or seek an in-person office evaluation/go to [facility] at [facility] for any urgent or concerning symptoms. Please refer to After Visit Summary for other counseling recommendations.   I provided 5 minutes of non-face-to-face time during this encounter. There was 5 minutes of chart review time spent prior to this encounter. Total time spent = 10 minutes.  Return in about 3 weeks (around 03/16/2020) for Return OB - My Chart video.  Future Appointments  Date Time Provider Department Center  03/01/2020  2:30 PM Adam Phenix, MD [facility] None  03/10/2020  2:30 PM [facility]    [name], CNM Center for [employer], [facility]

## 2020-02-24 NOTE — Patient Instructions (Addendum)
How to Take Your Blood Pressure You can take your blood pressure at home with a machine. You may need to check your blood pressure at home:  To check if you have high blood pressure (hypertension).  To check your blood pressure over time.  To make sure your blood pressure medicine is working. Supplies needed: You will need a blood pressure machine, or monitor. You can buy one at a drugstore or online. When choosing one:  Choose one with an arm cuff.  Choose one that wraps around your upper arm. Only one finger should fit between your arm and the cuff.  Do not choose one that measures your blood pressure from your wrist or finger. Your doctor can suggest a monitor. How to prepare Avoid these things for 30 minutes before checking your blood pressure:  Drinking caffeine.  Drinking alcohol.  Eating.  Smoking.  Exercising. Five minutes before checking your blood pressure:  Pee.  Sit in a dining chair. Avoid sitting in a soft couch or armchair.  Be quiet. Do not talk. How to take your blood pressure Follow the instructions that came with your machine. If you have a digital blood pressure monitor, these may be the instructions: 1. Sit up straight. 2. Place your feet on the floor. Do not cross your ankles or legs. 3. Rest your left arm at the level of your heart. You may rest it on a table, desk, or chair. 4. Pull up your shirt sleeve. 5. Wrap the blood pressure cuff around the upper part of your left arm. The cuff should be 1 inch (2.5 cm) above your elbow. It is best to wrap the cuff around bare skin. 6. Fit the cuff snugly around your arm. You should be able to place only one finger between the cuff and your arm. 7. Put the cord inside the groove of your elbow. 8. Press the power button. 9. Sit quietly while the cuff fills with air and loses air. 10. Write down the numbers on the screen. 11. Wait 2-3 minutes and then repeat steps 1-10. What do the numbers mean? Two  numbers make up your blood pressure. The first number is called systolic pressure. The second is called diastolic pressure. An example of a blood pressure reading is "120 over 80" (or 120/80). If you are an adult and do not have a medical condition, use this guide to find out if your blood pressure is normal: Normal  First number: below 120.  Second number: below 80. Elevated  First number: 120-129.  Second number: below 80. Hypertension stage 1  First number: 130-139.  Second number: 80-89. Hypertension stage 2  First number: 140 or above.  Second number: 90 or above. Your blood pressure is above normal even if only the top or bottom number is above normal. Follow these instructions at home:  Check your blood pressure as often as your doctor tells you to.  Take your monitor to your next doctor's appointment. Your doctor will: ? Make sure you are using it correctly. ? Make sure it is working right.  Make sure you understand what your blood pressure numbers should be.  Tell your doctor if your medicines are causing side effects. Contact a doctor if:  Your blood pressure keeps being high. Get help right away if:  Your first blood pressure number is higher than 180.  Your second blood pressure number is higher than 120. This information is not intended to replace advice given to you by your health   care provider. Make sure you discuss any questions you have with your health care provider. Document Revised: 08/08/2017 Document Reviewed: 02/02/2016 Elsevier Patient Education  2020 Elsevier Inc.  

## 2020-03-01 ENCOUNTER — Other Ambulatory Visit: Payer: Self-pay

## 2020-03-01 ENCOUNTER — Ambulatory Visit (INDEPENDENT_AMBULATORY_CARE_PROVIDER_SITE_OTHER): Payer: 59 | Admitting: Obstetrics & Gynecology

## 2020-03-01 VITALS — BP 100/66 | HR 90 | Wt 223.8 lb

## 2020-03-01 DIAGNOSIS — R87612 Low grade squamous intraepithelial lesion on cytologic smear of cervix (LGSIL): Secondary | ICD-10-CM | POA: Diagnosis not present

## 2020-03-01 DIAGNOSIS — Z3A22 22 weeks gestation of pregnancy: Secondary | ICD-10-CM

## 2020-03-01 DIAGNOSIS — Z3480 Encounter for supervision of other normal pregnancy, unspecified trimester: Secondary | ICD-10-CM

## 2020-03-01 DIAGNOSIS — Z3482 Encounter for supervision of other normal pregnancy, second trimester: Secondary | ICD-10-CM

## 2020-03-01 NOTE — Progress Notes (Signed)
Patient given informed consent, signed copy in the chart, time out was performed.  Placed in lithotomy position. Cervix viewed with speculum and colposcope after application of acetic acid.   Colposcopy adequate?  yes Acetowhite lesions? Vary mild around perimeter of os Punctation? no Mosaicism?  no Abnormal vasculature?  no Biopsies? no ECC? no  COMMENTS:  Patient was given post procedure instructions.  Expect to repeat procedure w/Bx 6 weeks PP Scheryl Darter, MD

## 2020-03-01 NOTE — Progress Notes (Signed)
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

## 2020-03-01 NOTE — Progress Notes (Signed)
   PRENATAL VISIT NOTE  Subjective:  Leah Molina is a 34 y.o. [phone] at [redacted]w[redacted]d being seen today for ongoing prenatal care.  She is currently monitored for the following issues for this low-risk pregnancy and has Supervision of other normal pregnancy, antepartum on their problem list.  Patient reports no complaints.  Contractions: Not present. Vag. Bleeding: None.  Movement: Present. Denies leaking of fluid.   The following portions of the patient's history were reviewed and updated as appropriate: allergies, current medications, past family history, past medical history, past social history, past surgical history and problem list.   Objective:   Vitals:   03/01/20 1427  BP: 100/66  Pulse: 90  Weight: 223 lb 12.8 oz (101.5 kg)    Fetal Status: Fetal Heart Rate (bpm): 155   Movement: Present     General:  Alert, oriented and cooperative. Patient is in no acute distress.  Skin: Skin is warm and dry. No rash noted.   Cardiovascular: Normal heart rate noted  Respiratory: Normal respiratory effort, no problems with respiration noted  Abdomen: Soft, gravid, appropriate for gestational age.  Pain/Pressure: Absent     Pelvic: Cervical exam deferred        Extremities: Normal range of motion.  Edema: None  Mental Status: Normal mood and affect. Normal behavior. Normal judgment and thought content.   Assessment and Plan:  Pregnancy: [id number] at [redacted]w[redacted]d 1. Supervision of other normal pregnancy, antepartum Doing well  2. LGSIL on Pap smear of cervix Colposcopy, biopsies were deferred  Preterm labor symptoms and general obstetric precautions including but not limited to vaginal bleeding, contractions, leaking of fluid and fetal movement were reviewed in detail with the patient. Please refer to After Visit Summary for other counseling recommendations.   Return in about 1 month (around 03/31/2020) for 2 hr GTT.  Future Appointments  Date Time Provider Department Center  03/10/2020  2:30 PM  [facility]    Scheryl Darter, MD

## 2020-03-01 NOTE — Patient Instructions (Signed)
Colposcopy, Care After This sheet gives you information about how to care for yourself after your procedure. Your doctor may also give you more specific instructions. If you have problems or questions, contact your doctor. What can I expect after the procedure? If you did not have a tissue sample removed (did not have a biopsy), you may only have some spotting for a few days. You can go back to your normal activities. If you had a tissue sample removed, it is common to have:  Soreness and pain. This may last for a few days.  Light-headedness.  Mild bleeding from your vagina or dark-colored, grainy discharge from your vagina. This may last for a few days. You may need to wear a sanitary pad.  Spotting for at least 48 hours after the procedure. Follow these instructions at home:   Take over-the-counter and prescription medicines only as told by your doctor. Ask your doctor what medicines you can start taking again. This is very important if you take blood-thinning medicine.  Do not drive or use heavy machinery while taking prescription pain medicine.  For 3 days, or as long as your doctor tells you, avoid: ? Douching. ? Using tampons. ? Having sex.  If you use birth control (contraception), keep using it.  Limit activity for the first day after the procedure. Ask your doctor what activities are safe for you.  It is up to you to get the results of your procedure. Ask your doctor when your results will be ready.  Keep all follow-up visits as told by your doctor. This is important. Contact a doctor if:  You get a skin rash. Get help right away if:  You are bleeding a lot from your vagina. It is a lot of bleeding if you are using more than one pad an hour for 2 hours in a row.  You have clumps of blood (blood clots) coming from your vagina.  You have a fever.  You have chills  You have pain in your lower belly (pelvic area).  You have signs of infection, such as vaginal  discharge that is: ? Different than usual. ? Yellow. ? Bad-smelling.  You have very pain or cramps in your lower belly that do not get better with medicine.  You feel light-headed.  You feel dizzy.  You pass out (faint). Summary  If you did not have a tissue sample removed (did not have a biopsy), you may only have some spotting for a few days. You can go back to your normal activities.  If you had a tissue sample removed, it is common to have mild pain and spotting for 48 hours.  For 3 days, or as long as your doctor tells you, avoid douching, using tampons and having sex.  Get help right away if you have bleeding, very bad pain, or signs of infection. This information is not intended to replace advice given to you by your health care provider. Make sure you discuss any questions you have with your health care provider. Document Revised: 08/08/2017 Document Reviewed: 05/15/2016 Elsevier Patient Education  2020 Elsevier Inc.  

## 2020-03-10 ENCOUNTER — Ambulatory Visit: Payer: 59 | Attending: Obstetrics and Gynecology

## 2020-03-10 ENCOUNTER — Other Ambulatory Visit: Payer: Self-pay

## 2020-03-10 DIAGNOSIS — Z362 Encounter for other antenatal screening follow-up: Secondary | ICD-10-CM | POA: Diagnosis not present

## 2020-03-10 DIAGNOSIS — Z3480 Encounter for supervision of other normal pregnancy, unspecified trimester: Secondary | ICD-10-CM

## 2020-03-10 DIAGNOSIS — Z3A23 23 weeks gestation of pregnancy: Secondary | ICD-10-CM

## 2020-03-10 DIAGNOSIS — E669 Obesity, unspecified: Secondary | ICD-10-CM

## 2020-03-10 DIAGNOSIS — O99212 Obesity complicating pregnancy, second trimester: Secondary | ICD-10-CM

## 2020-04-06 ENCOUNTER — Other Ambulatory Visit: Payer: Self-pay

## 2020-04-06 ENCOUNTER — Encounter: Payer: Self-pay | Admitting: General Practice

## 2020-04-06 ENCOUNTER — Ambulatory Visit (INDEPENDENT_AMBULATORY_CARE_PROVIDER_SITE_OTHER): Payer: 59 | Admitting: Obstetrics and Gynecology

## 2020-04-06 VITALS — BP 104/61 | HR 80 | Temp 97.7°F | Wt 224.0 lb

## 2020-04-06 DIAGNOSIS — Z3480 Encounter for supervision of other normal pregnancy, unspecified trimester: Secondary | ICD-10-CM

## 2020-04-06 NOTE — Progress Notes (Signed)
   LOW-RISK PREGNANCY OFFICE VISIT Patient name: Leah Molina MRN [id number]  Date of birth: 10-07-1985 Chief Complaint:   Routine Prenatal Visit  History of Present Illness:   Leah Molina is a 34 y.o. [id number] female at [redacted]w[redacted]d with an Estimated Date of Delivery: 07/02/20 being seen today for ongoing management of a low-risk pregnancy.  Today she reports occasional contractions. Contractions: Irritability. Vag. Bleeding: None.  Movement: Present. denies leaking of fluid. Review of Systems:   Pertinent items are noted in HPI Denies abnormal vaginal discharge w/ itching/odor/irritation, headaches, visual changes, shortness of breath, chest pain, abdominal pain, severe nausea/vomiting, or problems with urination or bowel movements unless otherwise stated above. Pertinent History Reviewed:  Reviewed past medical,surgical, social, obstetrical and family history.  Reviewed problem list, medications and allergies. Physical Assessment:   Vitals:   04/06/20 0830  BP: (!) 104/61  Pulse: 80  Temp: 97.7 F (36.5 C)  Weight: (!) 224 lb (101.6 kg)  Body mass index is 35.08 kg/m.        Physical Examination:   General appearance: Well appearing, and in no distress  Mental status: Alert, oriented to person, place, and time  Skin: Warm & dry  Cardiovascular: Normal heart rate noted  Respiratory: Normal respiratory effort, no distress  Abdomen: Soft, gravid, nontender  Pelvic: Cervical exam deferred         Extremities: Edema: None  Fetal Status: Fetal Heart Rate (bpm): 150 Fundal Height: 29 cm Movement: Present    No results found for this or any previous visit (from the past 24 hour(s)).  Assessment & Plan:  1) Low-risk pregnancy [id number] at [redacted]w[redacted]d with an Estimated Date of Delivery: 07/02/20   2) Supervision of other normal pregnancy, antepartum  - Glucose Tolerance, 2 Hours w/1 Hour,  - HIV Antibody (routine testing w rflx),  - RPR,  - CBC   Meds: No orders of the defined types  were placed in this encounter.  Labs/procedures today: 2 hr GTT and 3rd trimester labs  Plan:  Continue routine obstetrical care   Reviewed: Preterm labor symptoms and general obstetric precautions including but not limited to vaginal bleeding, contractions, leaking of fluid and fetal movement were reviewed in detail with the patient.  All questions were answered. Has home bp cuff. Check bp weekly, let us know if >140/90.   Follow-up: Return in about 2 weeks (around 04/20/2020) for Return OB visit.  Orders Placed This Encounter  Procedures  . Glucose Tolerance, 2 Hours w/1 Hour  . HIV Antibody (routine testing w rflx)  . RPR  . CBC   [name] MSN, CNM 04/07/2020

## 2020-04-06 NOTE — Patient Instructions (Addendum)
https://www.cdc.gov/vaccines/hcp/vis/vis-statements/tdap.pdf">  Tdap (Tetanus, Diphtheria, Pertussis) Vaccine: What You Need to Know 1. Why get vaccinated? Tdap vaccine can prevent tetanus, diphtheria, and pertussis. Diphtheria and pertussis spread from person to person. Tetanus enters the body through cuts or wounds.  TETANUS (T) causes painful stiffening of the muscles. Tetanus can lead to serious health problems, including being unable to open the mouth, having trouble swallowing and breathing, or death.  DIPHTHERIA (D) can lead to difficulty breathing, heart failure, paralysis, or death.  PERTUSSIS (aP), also known as "whooping cough," can cause uncontrollable, violent coughing which makes it hard to breathe, eat, or drink. Pertussis can be extremely serious in babies and young children, causing pneumonia, convulsions, brain damage, or death. In teens and adults, it can cause weight loss, loss of bladder control, passing out, and rib fractures from severe coughing. 2. Tdap vaccine Tdap is only for children 7 years and older, adolescents, and adults.  Adolescents should receive a single dose of Tdap, preferably at age 106 or 12 years. Pregnant women should get a dose of Tdap during every pregnancy, to protect the newborn from pertussis. Infants are most at risk for severe, life-threatening complications from pertussis. Adults who have never received Tdap should get a dose of Tdap. Also, adults should receive a booster dose every 10 years, or earlier in the case of a severe and dirty wound or burn. Booster doses can be either Tdap or Td (a different vaccine that protects against tetanus and diphtheria but not pertussis). Tdap may be given at the same time as other vaccines. 3. Talk with your health care provider Tell your vaccine provider if the person getting the vaccine:  Has had an allergic reaction after a previous dose of any vaccine that protects against tetanus, diphtheria, or  pertussis, or has any severe, life-threatening allergies.  Has had a coma, decreased level of consciousness, or prolonged seizures within 7 days after a previous dose of any pertussis vaccine (DTP, DTaP, or Tdap).  Has seizures or another nervous system problem.  Has ever had Guillain-Barr Syndrome (also called GBS).  Has had severe pain or swelling after a previous dose of any vaccine that protects against tetanus or diphtheria. In some cases, your health care provider may decide to postpone Tdap vaccination to a future visit.  People with minor illnesses, such as a cold, may be vaccinated. People who are moderately or severely ill should usually wait until they recover before getting Tdap vaccine.  Your health care provider can give you more information. 4. Risks of a vaccine reaction  Pain, redness, or swelling where the shot was given, mild fever, headache, feeling tired, and nausea, vomiting, diarrhea, or stomachache sometimes happen after Tdap vaccine. People sometimes faint after medical procedures, including vaccination. Tell your provider if you feel dizzy or have vision changes or ringing in the ears.  As with any medicine, there is a very remote chance of a vaccine causing a severe allergic reaction, other serious injury, or death. 5. What if there is a serious problem? An allergic reaction could occur after the vaccinated person leaves the clinic. If you see signs of a severe allergic reaction (hives, swelling of the face and throat, difficulty breathing, a fast heartbeat, dizziness, or weakness), call 9-1-1 and get the person to the nearest hospital. For other signs that concern you, call your health care provider.  Adverse reactions should be reported to the Vaccine Adverse Event Reporting System (VAERS). Your health care provider will usually file this  report, or you can do it yourself. Visit the VAERS website at www.vaers.LAgents.no or call 548-411-7826. VAERS is only for  reporting reactions, and VAERS staff do not give medical advice. 6. The National Vaccine Injury Compensation Program The [employer] Vaccine Injury Compensation Program (VICP) is a federal program that was created to compensate people who may have been injured by certain vaccines. Visit the VICP website at SpiritualWord.at or call 231-641-8899 to learn about the program and about filing a claim. There is a time limit to file a claim for compensation. 7. How can I learn more?  Ask your health care provider.  Call your local or state health department.  Contact the Centers for Disease Control and Prevention (CDC): ? Call (325)211-2516 (1-800-CDC-INFO) or ? Visit CDC's website at PicCapture.uy Vaccine Information Statement Tdap (Tetanus, Diphtheria, Pertussis) Vaccine (12/09/2018) This information is not intended to replace advice given to you by your health care provider. Make sure you discuss any questions you have with your health care provider. Document Revised: 12/18/2018 Document Reviewed: 12/21/2018 Elsevier Patient Education  2020 Elsevier Inc.    Iron-Rich Diet  Iron is a mineral that helps your body to produce hemoglobin. Hemoglobin is a protein in red blood cells that carries oxygen to your body's tissues. Eating too little iron may cause you to feel weak and tired, and it can increase your risk of infection. Iron is naturally found in many foods, and many foods have iron added to them (iron-fortified foods). You may need to follow an iron-rich diet if you do not have enough iron in your body due to certain medical conditions. The amount of iron that you need each day depends on your age, your sex, and any medical conditions you have. Follow instructions from your health care provider or a diet and nutrition specialist (dietitian) about how much iron you should eat each day. What are tips for following this plan? Reading food labels  Check food labels to see  how many milligrams (mg) of iron are in each serving. Cooking  Cook foods in pots and pans that are made from iron.  Take these steps to make it easier for your body to absorb iron from certain foods: ? Soak beans overnight before cooking. ? Soak whole grains overnight and drain them before using. ? Ferment flours before baking, such as by using yeast in bread dough. Meal planning  When you eat foods that contain iron, you should eat them with foods that are high in vitamin C. These include oranges, peppers, tomatoes, potatoes, and mango. Vitamin C helps your body to absorb iron. General information  Take iron supplements only as told by your health care provider. An overdose of iron can be life-threatening. If you were prescribed iron supplements, take them with orange juice or a vitamin C supplement.  When you eat iron-fortified foods or take an iron supplement, you should also eat foods that naturally contain iron, such as meat, poultry, and fish. Eating naturally iron-rich foods helps your body to absorb the iron that is added to other foods or contained in a supplement.  Certain foods and drinks prevent your body from absorbing iron properly. Avoid eating these foods in the same meal as iron-rich foods or with iron supplements. These foods include: ? Coffee, black tea, and red wine. ? Milk, dairy products, and foods that are high in calcium. ? Beans and soybeans. ? Whole grains. What foods should I eat? Fruits Prunes. Raisins. Eat fruits high in vitamin C, such  as oranges, grapefruits, and strawberries, alongside iron-rich foods. Vegetables Spinach (cooked). Green peas. Broccoli. Fermented vegetables. Eat vegetables high in vitamin C, such as leafy greens, potatoes, bell peppers, and tomatoes, alongside iron-rich foods. Grains Iron-fortified breakfast cereal. Iron-fortified whole-wheat bread. Enriched rice. Sprouted grains. Meats and other proteins Beef liver. Oysters. Beef.  Shrimp. [place]. Chicken. Tuna. Sardines. Chickpeas. Nuts. Tofu. Pumpkin seeds. Beverages Tomato juice. Fresh orange juice. Prune juice. Hibiscus tea. Fortified instant breakfast shakes. Sweets and desserts Blackstrap molasses. Seasonings and condiments Tahini. Fermented soy sauce. Other foods Wheat germ. The items listed above may not be a complete list of recommended foods and beverages. Contact a dietitian for more information. What foods should I avoid? Grains Whole grains. Bran cereal. Bran flour. Oats. Meats and other proteins Soybeans. Products made from soy protein. Black beans. Lentils. Mung beans. Split peas. Dairy Milk. Cream. Cheese. Yogurt. Cottage cheese. Beverages Coffee. Black tea. Red wine. Sweets and desserts Cocoa. Chocolate. Ice cream. Other foods Basil. Oregano. Large amounts of parsley. The items listed above may not be a complete list of foods and beverages to avoid. Contact a dietitian for more information. Summary  Iron is a mineral that helps your body to produce hemoglobin. Hemoglobin is a protein in red blood cells that carries oxygen to your body's tissues.  Iron is naturally found in many foods, and many foods have iron added to them (iron-fortified foods).  When you eat foods that contain iron, you should eat them with foods that are high in vitamin C. Vitamin C helps your body to absorb iron.  Certain foods and drinks prevent your body from absorbing iron properly, such as whole grains and dairy products. You should avoid eating these foods in the same meal as iron-rich foods or with iron supplements. This information is not intended to replace advice given to you by your health care provider. Make sure you discuss any questions you have with your health care provider. Document Revised: 08/08/2017 Document Reviewed: 07/22/2017 Elsevier Patient Education  2020 [employer].

## 2020-04-07 ENCOUNTER — Encounter: Payer: Self-pay | Admitting: Obstetrics and Gynecology

## 2020-04-07 LAB — CBC
Hematocrit: 36.7 % (ref 34.0–46.6)
Hemoglobin: 12.2 g/dL (ref 11.1–15.9)
MCH: 31 pg (ref 26.6–33.0)
MCHC: 33.2 g/dL (ref 31.5–35.7)
MCV: 93 fL (ref 79–97)
Platelets: 196 10*3/uL (ref 150–450)
RBC: 3.94 x10E6/uL (ref 3.77–5.28)
RDW: 12.2 % (ref 11.7–15.4)
WBC: 10.1 10*3/uL (ref 3.4–10.8)

## 2020-04-07 LAB — GLUCOSE TOLERANCE, 2 HOURS W/ 1HR
Glucose, 1 hour: 149 mg/dL (ref 65–179)
Glucose, 2 hour: 115 mg/dL (ref 65–152)
Glucose, Fasting: 87 mg/dL (ref 65–91)

## 2020-04-07 LAB — HIV ANTIBODY (ROUTINE TESTING W REFLEX): HIV Screen 4th Generation wRfx: NONREACTIVE

## 2020-04-07 LAB — RPR: RPR Ser Ql: NONREACTIVE

## 2020-04-20 ENCOUNTER — Telehealth: Payer: 59 | Admitting: Obstetrics and Gynecology

## 2020-05-11 ENCOUNTER — Encounter: Payer: Self-pay | Admitting: Obstetrics and Gynecology

## 2020-05-11 ENCOUNTER — Telehealth (INDEPENDENT_AMBULATORY_CARE_PROVIDER_SITE_OTHER): Payer: 59 | Admitting: Obstetrics and Gynecology

## 2020-05-11 DIAGNOSIS — Z3480 Encounter for supervision of other normal pregnancy, unspecified trimester: Secondary | ICD-10-CM

## 2020-05-11 NOTE — Progress Notes (Signed)
   MY CHART VIDEO VIRTUAL OBSTETRICS VISIT ENCOUNTER NOTE  I connected with Leah Molina on 05/11/20 at  4:10 PM EDT by My Chart video at home and verified that I am speaking with the correct person using two identifiers. Provider located at [facility] for [employer] at [place].   I discussed the limitations, risks, security and privacy concerns of performing an evaluation and management service by My Chart video and the availability of in person appointments. I also discussed with the patient that there may be a patient responsible charge related to this service. The patient expressed understanding and agreed to proceed.  Subjective:  Leah Molina is a 34 y.o. [phone] at [redacted]w[redacted]d being followed for ongoing prenatal care.  She is currently monitored for the following issues for this low-risk pregnancy and has Supervision of other normal pregnancy, antepartum on their problem list.  Patient reports backache, but have not been wearing the maternity support belt. Reports fetal movement. Denies any contractions, bleeding or leaking of fluid.   The following portions of the patient's history were reviewed and updated as appropriate: allergies, current medications, past family history, past medical history, past social history, past surgical history and problem list.   Objective:   General:  Alert, oriented and cooperative.   Mental Status: Normal mood and affect perceived. Normal judgment and thought content.  Rest of physical exam deferred due to type of encounter  BP 112/82   Pulse 96   Wt 226 lb (102.5 kg)   LMP 09/26/2019 (Exact Date)   BMI 35.40 kg/m  **Done by patient's own at home BP cuff and scale  Assessment and Plan:  Pregnancy: [id number] at [redacted]w[redacted]d  1. Supervision of other normal pregnancy, antepartum - Anticipatory guidance for GBS and cervical exam at nv  Preterm labor symptoms and general obstetric precautions including but not limited to vaginal bleeding,  contractions, leaking of fluid and fetal movement were reviewed in detail with the patient.  I discussed the assessment and treatment plan with the patient. The patient was provided an opportunity to ask questions and all were answered. The patient agreed with the plan and demonstrated an understanding of the instructions. The patient was advised to call back or seek an in-person office evaluation/go to MAU at [facility] for any urgent or concerning symptoms. Please refer to After Visit Summary for other counseling recommendations.   I provided 5 minutes of non-face-to-face time during this encounter. There was 5 minutes of chart review time spent prior to this encounter. Total time spent = 10 minutes.  Return in about 4 weeks (around 06/08/2020) for Return OB w/GBS.  No future appointments.  [name], CNM Center for [employer], [facility]

## 2020-06-08 ENCOUNTER — Other Ambulatory Visit (HOSPITAL_COMMUNITY)
Admission: RE | Admit: 2020-06-08 | Discharge: 2020-06-08 | Disposition: A | Discharge status: Home / Self Care | Payer: 59 | Source: Ambulatory Visit | Attending: Medical | Admitting: Medical

## 2020-06-08 ENCOUNTER — Ambulatory Visit (INDEPENDENT_AMBULATORY_CARE_PROVIDER_SITE_OTHER): Payer: 59 | Admitting: Medical

## 2020-06-08 ENCOUNTER — Encounter: Payer: Self-pay | Admitting: Medical

## 2020-06-08 ENCOUNTER — Other Ambulatory Visit: Payer: Self-pay

## 2020-06-08 VITALS — BP 112/63 | HR 91 | Temp 98.1°F | Wt 228.2 lb

## 2020-06-08 DIAGNOSIS — Z3A36 36 weeks gestation of pregnancy: Secondary | ICD-10-CM

## 2020-06-08 DIAGNOSIS — Z3480 Encounter for supervision of other normal pregnancy, unspecified trimester: Secondary | ICD-10-CM | POA: Insufficient documentation

## 2020-06-08 NOTE — Progress Notes (Signed)
   PRENATAL VISIT NOTE  Subjective:  Leah Molina is a 34 y.o. [phone] at [redacted]w[redacted]d being seen today for ongoing prenatal care.  She is currently monitored for the following issues for this low-risk pregnancy and has Supervision of other normal pregnancy, antepartum on their problem list.  Patient reports varicose veins, morning headaches.  Contractions: Irregular. Vag. Bleeding: None.  Movement: Present. Denies leaking of fluid.   The following portions of the patient's history were reviewed and updated as appropriate: allergies, current medications, past family history, past medical history, past social history, past surgical history and problem list.   Objective:   Vitals:   06/08/20 1459  BP: 112/63  Pulse: 91  Temp: 98.1 F (36.7 C)  Weight: 228 lb 3.2 oz (103.5 kg)    Fetal Status: Fetal Heart Rate (bpm): 151 Fundal Height: 38 cm Movement: Present  Presentation: Vertex  General:  Alert, oriented and cooperative. Patient is in no acute distress.  Skin: Skin is warm and dry. No rash noted.   Cardiovascular: Normal heart rate noted  Respiratory: Normal respiratory effort, no problems with respiration noted  Abdomen: Soft, gravid, appropriate for gestational age.  Pain/Pressure: Present     Pelvic: Cervical exam performed in the presence of a chaperone Dilation: 1.5 Effacement (%): 30 Station: -3  Extremities: Normal range of motion.  Edema: None  Mental Status: Normal mood and affect. Normal behavior. Normal judgment and thought content.   Assessment and Plan:  Pregnancy: [id number] at [redacted]w[redacted]d 1. Supervision of other normal pregnancy, antepartum - Culture, beta strep (group b only) - Cervicovaginal ancillary only( [place]) - Declined Tdap and flu today   2. [redacted] weeks gestation of pregnancy  3. Headache - likely tension type or triggered by dehydration - normotensive today  - Discussed heat therapy, tylenol and increased PO hydration  Preterm labor symptoms and general  obstetric precautions including but not limited to vaginal bleeding, contractions, leaking of fluid and fetal movement were reviewed in detail with the patient. Please refer to After Visit Summary for other counseling recommendations.   No follow-ups on file.  No future appointments.  Vonzella Nipple, PA-C

## 2020-06-08 NOTE — Patient Instructions (Signed)
Fetal Movement Counts Patient Name: ________________________________________________ Patient Due Date: ____________________ What is a fetal movement count?  A fetal movement count is the number of times that you feel your baby move during a certain amount of time. This may also be called a fetal kick count. A fetal movement count is recommended for every pregnant woman. You may be asked to start counting fetal movements as early as week 28 of your pregnancy. Pay attention to when your baby is most active. You may notice your baby's sleep and wake cycles. You may also notice things that make your baby move more. You should do a fetal movement count:  When your baby is normally most active.  At the same time each day. A good time to count movements is while you are resting, after having something to eat and drink. How do I count fetal movements? 1. Find a quiet, comfortable area. Sit, or lie down on your side. 2. Write down the date, the start time and stop time, and the number of movements that you felt between those two times. Take this information with you to your health care visits. 3. Write down your start time when you feel the first movement. 4. Count kicks, flutters, swishes, rolls, and jabs. You should feel at least 10 movements. 5. You may stop counting after you have felt 10 movements, or if you have been counting for 2 hours. Write down the stop time. 6. If you do not feel 10 movements in 2 hours, contact your health care provider for further instructions. Your health care provider may want to do additional tests to assess your baby's well-being. Contact a health care provider if:  You feel fewer than 10 movements in 2 hours.  Your baby is not moving like he or she usually does. Date: ____________ Start time: ____________ Stop time: ____________ Movements: ____________ Date: ____________ Start time: ____________ Stop time: ____________ Movements: ____________ Date: ____________  Start time: ____________ Stop time: ____________ Movements: ____________ Date: ____________ Start time: ____________ Stop time: ____________ Movements: ____________ Date: ____________ Start time: ____________ Stop time: ____________ Movements: ____________ Date: ____________ Start time: ____________ Stop time: ____________ Movements: ____________ Date: ____________ Start time: ____________ Stop time: ____________ Movements: ____________ Date: ____________ Start time: ____________ Stop time: ____________ Movements: ____________ Date: ____________ Start time: ____________ Stop time: ____________ Movements: ____________ This information is not intended to replace advice given to you by your health care provider. Make sure you discuss any questions you have with your health care provider. Document Revised: 04/15/2019 Document Reviewed: 04/15/2019 Elsevier Patient Education  2020 [employer] Braxton Hicks Contractions Contractions of the uterus can occur throughout pregnancy, but they are not always a sign that you are in labor. You may have practice contractions called Braxton Hicks contractions. These false labor contractions are sometimes confused with true labor. What are Braxton Hicks contractions? Braxton Hicks contractions are tightening movements that occur in the muscles of the uterus before labor. Unlike true labor contractions, these contractions do not result in opening (dilation) and thinning of the cervix. Toward the end of pregnancy (32-34 weeks), Braxton Hicks contractions can happen more often and may become stronger. These contractions are sometimes difficult to tell apart from true labor because they can be very uncomfortable. You should not feel embarrassed if you go to the hospital with false labor. Sometimes, the only way to tell if you are in true labor is for your health care provider to look for changes in the cervix. The health care provider   will do a physical exam and may  monitor your contractions. If you are not in true labor, the exam should show that your cervix is not dilating and your water has not broken. If there are no other health problems associated with your pregnancy, it is completely safe for you to be sent home with false labor. You may continue to have Braxton Hicks contractions until you go into true labor. How to tell the difference between true labor and false labor True labor  Contractions last 30-70 seconds.  Contractions become very regular.  Discomfort is usually felt in the top of the uterus, and it spreads to the lower abdomen and low back.  Contractions do not go away with walking.  Contractions usually become more intense and increase in frequency.  The cervix dilates and gets thinner. False labor  Contractions are usually shorter and not as strong as true labor contractions.  Contractions are usually irregular.  Contractions are often felt in the front of the lower abdomen and in the groin.  Contractions may go away when you walk around or change positions while lying down.  Contractions get weaker and are shorter-lasting as time goes on.  The cervix usually does not dilate or become thin. Follow these instructions at home:   Take over-the-counter and prescription medicines only as told by your health care provider.  Keep up with your usual exercises and follow other instructions from your health care provider.  Eat and drink lightly if you think you are going into labor.  If Braxton Hicks contractions are making you uncomfortable: ? Change your position from lying down or resting to walking, or change from walking to resting. ? Sit and rest in a tub of warm water. ? Drink enough fluid to keep your urine pale yellow. Dehydration may cause these contractions. ? Do slow and deep breathing several times an hour.  Keep all follow-up prenatal visits as told by your health care provider. This is important. Contact a  health care provider if:  You have a fever.  You have continuous pain in your abdomen. Get help right away if:  Your contractions become stronger, more regular, and closer together.  You have fluid leaking or gushing from your vagina.  You pass blood-tinged mucus (bloody show).  You have bleeding from your vagina.  You have low back pain that you never had before.  You feel your baby's head pushing down and causing pelvic pressure.  Your baby is not moving inside you as much as it used to. Summary  Contractions that occur before labor are called Braxton Hicks contractions, false labor, or practice contractions.  Braxton Hicks contractions are usually shorter, weaker, farther apart, and less regular than true labor contractions. True labor contractions usually become progressively stronger and regular, and they become more frequent.  Manage discomfort from Braxton Hicks contractions by changing position, resting in a warm bath, drinking plenty of water, or practicing deep breathing. This information is not intended to replace advice given to you by your health care provider. Make sure you discuss any questions you have with your health care provider. Document Revised: 08/08/2017 Document Reviewed: 01/09/2017 Elsevier Patient Education  2020 Elsevier Inc.  

## 2020-06-09 LAB — CERVICOVAGINAL ANCILLARY ONLY
Chlamydia: NEGATIVE
Comment: NEGATIVE
Comment: NEGATIVE
Comment: NORMAL
Neisseria Gonorrhea: NEGATIVE
Trichomonas: NEGATIVE

## 2020-06-11 LAB — CULTURE, BETA STREP (GROUP B ONLY): Strep Gp B Culture: NEGATIVE

## 2020-06-23 ENCOUNTER — Other Ambulatory Visit: Payer: Self-pay | Admitting: Advanced Practice Midwife

## 2020-06-23 ENCOUNTER — Telehealth (INDEPENDENT_AMBULATORY_CARE_PROVIDER_SITE_OTHER): Payer: 59

## 2020-06-23 VITALS — BP 133/64 | HR 79 | Wt 229.0 lb

## 2020-06-23 DIAGNOSIS — Z3480 Encounter for supervision of other normal pregnancy, unspecified trimester: Secondary | ICD-10-CM

## 2020-06-23 DIAGNOSIS — Z3A38 38 weeks gestation of pregnancy: Secondary | ICD-10-CM

## 2020-06-23 NOTE — Progress Notes (Signed)
   OBSTETRICS PRENATAL VIRTUAL VISIT ENCOUNTER NOTE  Provider location: Center for [facility]   I connected with [name] on 06/23/20 at  8:30 AM EDT by MyChart Video Encounter at home and verified that I am speaking with the correct person using two identifiers.   I discussed the limitations, risks, security and privacy concerns of performing an evaluation and management service virtually and the availability of in person appointments. I also discussed with the patient that there may be a patient responsible charge related to this service. The patient expressed understanding and agreed to proceed. Subjective:  Leah Molina is a 34 y.o. [phone] at [redacted]w[redacted]d being seen today for ongoing prenatal care.  She is currently monitored for the following issues for this low-risk pregnancy and has Supervision of other normal pregnancy, antepartum on their problem list.  Patient reports no complaints. Patient reports some bloody show over the weekend, but no onset of labor.  Patient endorses some contractions, but nothing consistent.  She endorses fetal movement and denies vaginal concerns. Contractions: Not present. Vag. Bleeding: None.  Movement: Present. Denies any leaking of fluid.   The following portions of the patient's history were reviewed and updated as appropriate: allergies, current medications, past family history, past medical history, past social history, past surgical history and problem list.   Objective:   Vitals:   06/23/20 0829  BP: 133/64  Pulse: 79  Weight: 229 lb (103.9 kg)    Fetal Status:     Movement: Present     General:  Alert, oriented and cooperative. Patient is in no acute distress.  Respiratory: Normal respiratory effort, no problems with respiration noted  Mental Status: Normal mood and affect. Normal behavior. Normal judgment and thought content.  Rest of physical exam deferred due to type of encounter  Imaging: No results  found.  Assessment and Plan:  Pregnancy: [id number] at [redacted]w[redacted]d 1. Supervision of other normal pregnancy, antepartum -Reviewed GBS Results -Discussed IOL at 40+ weeks -Will schedule today for on or after 10/24. -Patient agreeable and without questions or concerns.  2. [redacted] weeks gestation of pregnancy -Doing well. -No questions or concerns.  Term labor symptoms and general obstetric precautions including but not limited to vaginal bleeding, contractions, leaking of fluid and fetal movement were reviewed in detail with the patient. I discussed the assessment and treatment plan with the patient. The patient was provided an opportunity to ask questions and all were answered. The patient agreed with the plan and demonstrated an understanding of the instructions. The patient was advised to call back or seek an in-person office evaluation/go to MAU at [facility] for any urgent or concerning symptoms. Please refer to After Visit Summary for other counseling recommendations.   I provided 4 minutes of face-to-face time during this encounter.  Return in about 1 week (around 06/30/2020) for LROB.  Future Appointments  Date Time Provider Department Center  06/28/2020  3:50 PM Sharyon Cable, CNM [facility] None    Cherre Robins, CNM Center for [employer], [facility]

## 2020-06-26 ENCOUNTER — Encounter (HOSPITAL_COMMUNITY): Payer: Self-pay

## 2020-06-27 ENCOUNTER — Telehealth (HOSPITAL_COMMUNITY): Payer: Self-pay | Admitting: *Deleted

## 2020-06-27 ENCOUNTER — Encounter (HOSPITAL_COMMUNITY): Payer: Self-pay

## 2020-06-27 NOTE — Telephone Encounter (Signed)
Preadmission screen  

## 2020-06-28 ENCOUNTER — Other Ambulatory Visit: Payer: Self-pay

## 2020-06-28 ENCOUNTER — Encounter: Payer: Self-pay | Admitting: Certified Nurse Midwife

## 2020-06-28 ENCOUNTER — Ambulatory Visit (INDEPENDENT_AMBULATORY_CARE_PROVIDER_SITE_OTHER): Payer: 59 | Admitting: Certified Nurse Midwife

## 2020-06-28 ENCOUNTER — Telehealth (HOSPITAL_COMMUNITY): Payer: Self-pay | Admitting: *Deleted

## 2020-06-28 VITALS — BP 127/79 | HR 88 | Temp 97.5°F | Wt 232.2 lb

## 2020-06-28 DIAGNOSIS — Z3480 Encounter for supervision of other normal pregnancy, unspecified trimester: Secondary | ICD-10-CM

## 2020-06-28 DIAGNOSIS — Z3A39 39 weeks gestation of pregnancy: Secondary | ICD-10-CM

## 2020-06-28 NOTE — Progress Notes (Signed)
   PRENATAL VISIT NOTE  Subjective:  Leah Molina is a 34 y.o. [phone] at [redacted]w[redacted]d being seen today for ongoing prenatal care.  She is currently monitored for the following issues for this low-risk pregnancy and has Supervision of other normal pregnancy, antepartum on their problem list.  Patient reports occasional contractions.  Contractions: Irregular. Vag. Bleeding: None.  Movement: Present. Denies leaking of fluid.   The following portions of the patient's history were reviewed and updated as appropriate: allergies, current medications, past family history, past medical history, past social history, past surgical history and problem list.   Objective:   Vitals:   06/28/20 1548  BP: 127/79  Pulse: 88  Temp: (!) 97.5 F (36.4 C)  Weight: 232 lb 3.2 oz (105.3 kg)    Fetal Status: Fetal Heart Rate (bpm): 147 Fundal Height: 39 cm Movement: Present  Presentation: Vertex  General:  Alert, oriented and cooperative. Patient is in no acute distress.  Skin: Skin is warm and dry. No rash noted.   Cardiovascular: Normal heart rate noted  Respiratory: Normal respiratory effort, no problems with respiration noted  Abdomen: Soft, gravid, appropriate for gestational age.  Pain/Pressure: Present     Pelvic: Cervical exam performed in the presence of a chaperone Dilation: 2.5 Effacement (%): Thick Station: -3  Extremities: Normal range of motion.  Edema: Mild pitting, slight indentation  Mental Status: Normal mood and affect. Normal behavior. Normal judgment and thought content.   Assessment and Plan:  Pregnancy: [id number] at [redacted]w[redacted]d 1. Supervision of other normal pregnancy, antepartum - Patient doing well, reports occasional contractions  - Routine prenatal care  - Patient has induction scheduled for Sunday at 40 weeks, patient request to push it back 1 week to 41 weeks. IOL changed  - Membrane sweep performed today   2. [redacted] weeks gestation of pregnancy - Antenatal screening discussed for post  dates   Term labor symptoms and general obstetric precautions including but not limited to vaginal bleeding, contractions, leaking of fluid and fetal movement were reviewed in detail with the patient. Please refer to After Visit Summary for other counseling recommendations.   Return in about 1 week (around 07/05/2020) for ROB, NST.  Future Appointments  Date Time Provider Department Center  07/05/2020  8:10 AM Bernerd Limbo, CNM [facility] None  07/09/2020  7:55 AM [facility] SCHED ROOM [facility] None    Sharyon Cable, CNM

## 2020-06-28 NOTE — Patient Instructions (Signed)
Reasons to go to MAU:  1.  Contractions are  5 minutes apart or less, each last 1 minute, these have been going on for 1-2 hours, and you cannot walk or talk during them 2.  You have a large gush of fluid, or a trickle of fluid that will not stop and you have to wear a pad 3.  You have bleeding that is bright red, heavier than spotting--like menstrual bleeding (spotting can be normal in early labor or after a check of your cervix) 4.  You do not feel the baby moving like he/she normally does  

## 2020-06-28 NOTE — Telephone Encounter (Signed)
Preadmission screen  

## 2020-06-30 ENCOUNTER — Other Ambulatory Visit (HOSPITAL_COMMUNITY): Payer: 59

## 2020-07-02 ENCOUNTER — Inpatient Hospital Stay (HOSPITAL_COMMUNITY): Payer: 59

## 2020-07-04 ENCOUNTER — Telehealth (HOSPITAL_COMMUNITY): Payer: Self-pay | Admitting: *Deleted

## 2020-07-04 NOTE — Telephone Encounter (Signed)
Preadmission screen  

## 2020-07-05 ENCOUNTER — Other Ambulatory Visit: Payer: Self-pay

## 2020-07-05 ENCOUNTER — Ambulatory Visit (INDEPENDENT_AMBULATORY_CARE_PROVIDER_SITE_OTHER): Payer: 59 | Admitting: Certified Nurse Midwife

## 2020-07-05 ENCOUNTER — Encounter: Payer: Self-pay | Admitting: General Practice

## 2020-07-05 ENCOUNTER — Other Ambulatory Visit: Payer: Self-pay | Admitting: Advanced Practice Midwife

## 2020-07-05 ENCOUNTER — Telehealth (HOSPITAL_COMMUNITY): Payer: Self-pay | Admitting: *Deleted

## 2020-07-05 VITALS — BP 135/78 | HR 85 | Temp 98.4°F | Wt 230.0 lb

## 2020-07-05 DIAGNOSIS — O480 Post-term pregnancy: Secondary | ICD-10-CM

## 2020-07-05 DIAGNOSIS — Z3483 Encounter for supervision of other normal pregnancy, third trimester: Secondary | ICD-10-CM

## 2020-07-05 NOTE — Telephone Encounter (Signed)
Preadmission screen  

## 2020-07-05 NOTE — Progress Notes (Signed)
   PRENATAL VISIT NOTE  Subjective:  Leah Molina is a 34 y.o. [phone] at [redacted]w[redacted]d being seen today for ongoing prenatal care.  She is currently monitored for the following issues for this low-risk pregnancy and has Supervision of other normal pregnancy, antepartum on their problem list.  Patient reports no complaints.  Contractions: Irregular. Vag. Bleeding: None.  Movement: Present. Denies leaking of fluid.   The following portions of the patient's history were reviewed and updated as appropriate: allergies, current medications, past family history, past medical history, past social history, past surgical history and problem list.   Objective:   Vitals:   07/05/20 0817  BP: 135/78  Pulse: 85  Temp: 98.4 F (36.9 C)  Weight: 230 lb (104.3 kg)    Fetal Status: Fetal Heart Rate (bpm): 145 Fundal Height: 40 cm Movement: Present  Presentation: Vertex  General:  Alert, oriented and cooperative. Patient is in no acute distress.  Skin: Skin is warm and dry. No rash noted.   Cardiovascular: Normal heart rate noted  Respiratory: Normal respiratory effort, no problems with respiration noted  Abdomen: Soft, gravid, appropriate for gestational age.  Pain/Pressure: Present     Pelvic: Cervical exam performed in the presence of a chaperone Dilation: 2.5 Effacement (%): 60 Station: -3  Extremities: Normal range of motion.  Edema: Mild pitting, slight indentation  Mental Status: Normal mood and affect. Normal behavior. Normal judgment and thought content.   Fetal Tracing: reactive Baseline: 145 Variability: moderate Accelerations: present Decelerations: none Toco: UI  Assessment and Plan:  Pregnancy: [id number] at [redacted]w[redacted]d 1. Encounter for supervision of other normal pregnancy in third trimester - Pt doing well although frustrated she has not gone into labor yet - Reviewed positioning of the baby (ROP by Leopold's) and encouraged pt to spend time on her hands/knees doing pelvic rocking/tilts to  encourage better positioning of baby. - Gave reassurance about her possible IOL  2. Post-term pregnancy, 40-42 weeks of gestation - Reactive NST with easily palpable fetal movement  Term labor symptoms and general obstetric precautions including but not limited to vaginal bleeding, contractions, leaking of fluid and fetal movement were reviewed in detail with the patient. Please refer to After Visit Summary for other counseling recommendations.   Return in about 5 weeks (around 08/09/2020), or Postpartum.  Future Appointments  Date Time Provider Department Center  07/07/2020  9:45 AM [facility]EN[facility] None  07/09/2020  7:55 AM [facility] SCHED ROOM [facility] None    Edd Arbour, CNM, MSN, [facility] 07/05/20 9:17 AM

## 2020-07-07 ENCOUNTER — Other Ambulatory Visit (HOSPITAL_COMMUNITY)
Admission: RE | Admit: 2020-07-07 | Discharge: 2020-07-07 | Disposition: A | Discharge status: Home / Self Care | Payer: 59 | Source: Ambulatory Visit | Attending: Family Medicine | Admitting: Family Medicine

## 2020-07-07 DIAGNOSIS — Z20822 Contact with and (suspected) exposure to covid-19: Secondary | ICD-10-CM | POA: Insufficient documentation

## 2020-07-07 DIAGNOSIS — Z01812 Encounter for preprocedural laboratory examination: Secondary | ICD-10-CM | POA: Insufficient documentation

## 2020-07-08 LAB — SARS CORONAVIRUS 2 (TAT 6-24 HRS): SARS Coronavirus 2: NEGATIVE

## 2020-07-09 ENCOUNTER — Inpatient Hospital Stay (HOSPITAL_COMMUNITY): Payer: 59

## 2020-07-09 ENCOUNTER — Inpatient Hospital Stay (HOSPITAL_COMMUNITY)
Admission: AD | Admit: 2020-07-09 | Discharge: 2020-07-11 | DRG: 807 | Disposition: A | Discharge status: Home / Self Care | Payer: 59 | Attending: Obstetrics and Gynecology | Admitting: Obstetrics and Gynecology

## 2020-07-09 ENCOUNTER — Encounter (HOSPITAL_COMMUNITY): Payer: Self-pay | Admitting: Family Medicine

## 2020-07-09 ENCOUNTER — Other Ambulatory Visit: Payer: Self-pay

## 2020-07-09 DIAGNOSIS — Z20822 Contact with and (suspected) exposure to covid-19: Secondary | ICD-10-CM | POA: Diagnosis present

## 2020-07-09 DIAGNOSIS — Z8759 Personal history of other complications of pregnancy, childbirth and the puerperium: Secondary | ICD-10-CM

## 2020-07-09 DIAGNOSIS — O480 Post-term pregnancy: Secondary | ICD-10-CM | POA: Diagnosis present

## 2020-07-09 DIAGNOSIS — O3663X0 Maternal care for excessive fetal growth, third trimester, not applicable or unspecified: Secondary | ICD-10-CM | POA: Diagnosis present

## 2020-07-09 DIAGNOSIS — R87612 Low grade squamous intraepithelial lesion on cytologic smear of cervix (LGSIL): Secondary | ICD-10-CM | POA: Diagnosis present

## 2020-07-09 DIAGNOSIS — Z3A41 41 weeks gestation of pregnancy: Secondary | ICD-10-CM

## 2020-07-09 DIAGNOSIS — Z3480 Encounter for supervision of other normal pregnancy, unspecified trimester: Secondary | ICD-10-CM

## 2020-07-09 LAB — CBC
HCT: 38.6 % (ref 36.0–46.0)
Hemoglobin: 12.8 g/dL (ref 12.0–15.0)
MCH: 29.7 pg (ref 26.0–34.0)
MCHC: 33.2 g/dL (ref 30.0–36.0)
MCV: 89.6 fL (ref 80.0–100.0)
Platelets: 176 10*3/uL (ref 150–400)
RBC: 4.31 MIL/uL (ref 3.87–5.11)
RDW: 14.4 % (ref 11.5–15.5)
WBC: 8.8 10*3/uL (ref 4.0–10.5)
nRBC: 0 % (ref 0.0–0.2)

## 2020-07-09 LAB — TYPE AND SCREEN
ABO/RH(D): O POS
Antibody Screen: NEGATIVE

## 2020-07-09 MED ORDER — LACTATED RINGERS IV SOLN
500.0000 mL | Freq: Once | INTRAVENOUS | Status: AC
  Administered 2020-07-09: 500 mL via INTRAVENOUS

## 2020-07-09 MED ORDER — COCONUT OIL OIL: 1.0000 "application " | TOPICAL_OIL | Status: DC | PRN

## 2020-07-09 MED ORDER — OXYTOCIN-SODIUM CHLORIDE 30-0.9 UT/500ML-% IV SOLN: 2.5000 [IU]/h | INTRAVENOUS | Status: DC

## 2020-07-09 MED ORDER — LACTATED RINGERS IV SOLN: INTRAVENOUS | Status: DC

## 2020-07-09 MED ORDER — OXYTOCIN-SODIUM CHLORIDE 30-0.9 UT/500ML-% IV SOLN
1.0000 m[IU]/min | INTRAVENOUS | Status: DC
  Administered 2020-07-09: 2 m[IU]/min via INTRAVENOUS
  Filled 2020-07-09: qty 500

## 2020-07-09 MED ORDER — BENZOCAINE-MENTHOL 20-0.5 % EX AERO: 1.0000 "application " | INHALATION_SPRAY | CUTANEOUS | Status: DC | PRN

## 2020-07-09 MED ORDER — SODIUM CHLORIDE 0.9 % IV SOLN: 250.0000 mL | INTRAVENOUS | Status: DC | PRN

## 2020-07-09 MED ORDER — IBUPROFEN 600 MG PO TABS
600.0000 mg | ORAL_TABLET | Freq: Four times a day (QID) | ORAL | Status: DC
  Administered 2020-07-09 – 2020-07-10 (×4): 600 mg via ORAL
  Filled 2020-07-09 (×6): qty 1

## 2020-07-09 MED ORDER — TERBUTALINE SULFATE 1 MG/ML IJ SOLN: 0.2500 mg | Freq: Once | INTRAMUSCULAR | Status: DC | PRN

## 2020-07-09 MED ORDER — LACTATED RINGERS IV SOLN: 500.0000 mL | INTRAVENOUS | Status: DC | PRN

## 2020-07-09 MED ORDER — DIBUCAINE (PERIANAL) 1 % EX OINT
1.0000 "application " | TOPICAL_OINTMENT | CUTANEOUS | Status: DC | PRN
  Administered 2020-07-10: 1 via RECTAL
  Filled 2020-07-09: qty 28

## 2020-07-09 MED ORDER — TETANUS-DIPHTH-ACELL PERTUSSIS 5-2.5-18.5 LF-MCG/0.5 IM SUSY: 0.5000 mL | PREFILLED_SYRINGE | Freq: Once | INTRAMUSCULAR | Status: DC

## 2020-07-09 MED ORDER — ONDANSETRON HCL 4 MG/2ML IJ SOLN: 4.0000 mg | Freq: Four times a day (QID) | INTRAMUSCULAR | Status: DC | PRN

## 2020-07-09 MED ORDER — ONDANSETRON HCL 4 MG/2ML IJ SOLN: 4.0000 mg | INTRAMUSCULAR | Status: DC | PRN

## 2020-07-09 MED ORDER — FENTANYL CITRATE (PF) 100 MCG/2ML IJ SOLN
50.0000 ug | INTRAMUSCULAR | Status: DC | PRN
  Administered 2020-07-09: 100 ug via INTRAVENOUS
  Filled 2020-07-09: qty 2

## 2020-07-09 MED ORDER — MISOPROSTOL 50MCG HALF TABLET
ORAL_TABLET | ORAL | Status: AC
  Filled 2020-07-09: qty 1

## 2020-07-09 MED ORDER — ACETAMINOPHEN 325 MG PO TABS: 650.0000 mg | ORAL_TABLET | ORAL | Status: DC | PRN

## 2020-07-09 MED ORDER — OXYTOCIN-SODIUM CHLORIDE 30-0.9 UT/500ML-% IV SOLN: 1.0000 m[IU]/min | INTRAVENOUS | Status: DC

## 2020-07-09 MED ORDER — MISOPROSTOL 50MCG HALF TABLET
50.0000 ug | ORAL_TABLET | ORAL | Status: DC | PRN
  Administered 2020-07-09: 50 ug via BUCCAL

## 2020-07-09 MED ORDER — ONDANSETRON HCL 4 MG PO TABS: 4.0000 mg | ORAL_TABLET | ORAL | Status: DC | PRN

## 2020-07-09 MED ORDER — MEASLES, MUMPS & RUBELLA VAC IJ SOLR: 0.5000 mL | Freq: Once | INTRAMUSCULAR | Status: DC

## 2020-07-09 MED ORDER — DIPHENHYDRAMINE HCL 50 MG/ML IJ SOLN: 12.5000 mg | INTRAMUSCULAR | Status: DC | PRN

## 2020-07-09 MED ORDER — OXYTOCIN BOLUS FROM INFUSION: 333.0000 mL | Freq: Once | INTRAVENOUS | Status: DC

## 2020-07-09 MED ORDER — SODIUM CHLORIDE 0.9% FLUSH: 3.0000 mL | Freq: Two times a day (BID) | INTRAVENOUS | Status: DC

## 2020-07-09 MED ORDER — PHENYLEPHRINE 40 MCG/ML (10ML) SYRINGE FOR IV PUSH (FOR BLOOD PRESSURE SUPPORT): 80.0000 ug | PREFILLED_SYRINGE | INTRAVENOUS | Status: DC | PRN

## 2020-07-09 MED ORDER — LIDOCAINE HCL (PF) 1 % IJ SOLN: 30.0000 mL | INTRAMUSCULAR | Status: DC | PRN

## 2020-07-09 MED ORDER — EPHEDRINE 5 MG/ML INJ: 10.0000 mg | INTRAVENOUS | Status: DC | PRN

## 2020-07-09 MED ORDER — SOD CITRATE-CITRIC ACID 500-334 MG/5ML PO SOLN: 30.0000 mL | ORAL | Status: DC | PRN

## 2020-07-09 MED ORDER — FENTANYL-BUPIVACAINE-NACL 0.5-0.125-0.9 MG/250ML-% EP SOLN
12.0000 mL/h | EPIDURAL | Status: DC | PRN
  Filled 2020-07-09: qty 250

## 2020-07-09 MED ORDER — WITCH HAZEL-GLYCERIN EX PADS
1.0000 "application " | MEDICATED_PAD | CUTANEOUS | Status: DC | PRN
  Administered 2020-07-10: 1 via TOPICAL

## 2020-07-09 MED ORDER — SODIUM CHLORIDE 0.9% FLUSH
3.0000 mL | INTRAVENOUS | Status: DC | PRN
  Administered 2020-07-09: 3 mL via INTRAVENOUS

## 2020-07-09 MED ORDER — PRENATAL MULTIVITAMIN CH
1.0000 | ORAL_TABLET | Freq: Every day | ORAL | Status: DC
  Administered 2020-07-10 – 2020-07-11 (×2): 1 via ORAL
  Filled 2020-07-09 (×2): qty 1

## 2020-07-09 MED ORDER — SENNOSIDES-DOCUSATE SODIUM 8.6-50 MG PO TABS
1.0000 | ORAL_TABLET | Freq: Two times a day (BID) | ORAL | Status: DC
  Administered 2020-07-09 – 2020-07-11 (×4): 1 via ORAL
  Filled 2020-07-09 (×4): qty 1

## 2020-07-09 MED ORDER — DIPHENHYDRAMINE HCL 25 MG PO CAPS: 25.0000 mg | ORAL_CAPSULE | Freq: Four times a day (QID) | ORAL | Status: DC | PRN

## 2020-07-09 NOTE — Discharge Summary (Signed)
Postpartum Discharge Summary       Patient Name: Leah Molina DOB: 10/07/1985 MRN: [id number]  Date of admission: 07/09/2020 Delivery date:07/09/2020  Delivering provider: Zettie Cooley E  Date of discharge: 07/11/2020  Admitting diagnosis: Post-dates pregnancy [O48.0] Intrauterine pregnancy: [redacted]w[redacted]d    Secondary diagnosis:  Active Problems:   Post-dates pregnancy   Vaginal delivery  Additional problems: macrosomia    Discharge diagnosis: Term Pregnancy Delivered                                              Post partum procedures:none Augmentation: Pitocin and Cytotec Complications: None  Hospital course: Induction of Labor With Vaginal Delivery   34y.o. yo [id number] admitted to the hospital 07/09/2020 for induction of labor.  Indication for induction: Postdates.  Patient had an uncomplicated labor course as follows: Membrane Rupture Time/Date: 7:12 PM ,07/09/2020   Delivery Method:Vaginal, Spontaneous  sixty second shoulder dystocia, relieved with removal of posterior arm  Episiotomy:   Lacerations:  None  Details of delivery can be found in separate delivery note.  Patient had a routine postpartum course. Patient is discharged home 07/11/20.  Newborn Data: Birth date:07/09/2020  Birth time:7:20 PM  Gender:Female  Living status:Living  Apgars:5 ,9  Weight:4479 g   Magnesium Sulfate received: No BMZ received: No Rhophylac:N/A MMR:No T-DaP: declined Flu: No declined Transfusion:No  Physical exam  Vitals:   07/10/20 0948 07/10/20 1620 07/10/20 2100 07/11/20 0500  BP: 114/67 117/75 104/63 109/68  Pulse: 87 77 75 70  Resp: 18 17 16 16   Temp: 98.1 F (36.7 C) 98.6 F (37 C) 98.5 F (36.9 C) 98.6 F (37 C)  TempSrc: Oral Oral Oral Oral  SpO2:   100% 100%  Weight:      Height:       General: alert, cooperative and no distress Lochia: appropriate Uterine Fundus: firm Incision: N/A DVT Evaluation: No evidence of DVT seen on physical  exam. Negative Homan's sign. No cords or calf tenderness. No significant calf/ankle edema. Labs: Lab Results  Component Value Date   WBC 8.8 07/09/2020   HGB 12.8 07/09/2020   HCT 38.6 07/09/2020   MCV 89.6 07/09/2020   PLT 176 07/09/2020   No flowsheet data found. Edinburgh Score: Edinburgh Postnatal Depression Scale Screening Tool 07/09/2020  I have been able to laugh and see the funny side of things. 0  I have looked forward with enjoyment to things. 0  I have blamed myself unnecessarily when things went wrong. 0  I have been anxious or worried for no good reason. 1  I have felt scared or panicky for no good reason. 0  Things have been getting on top of me. 1  I have been so unhappy that I have had difficulty sleeping. 0  I have felt sad or miserable. 0  I have been so unhappy that I have been crying. 0  The thought of harming myself has occurred to me. 0  Edinburgh Postnatal Depression Scale Total 2     After visit meds:  Allergies as of 07/11/2020   No Known Allergies     Medication List    STOP taking these medications   Blood Pressure Monitor Automat Devi   Gojji Weight Scale Misc     TAKE these medications   ibuprofen 600 MG tablet Commonly known  as: ADVIL Take 1 tablet (600 mg total) by mouth every 6 (six) hours.   Vitafol Gummies 3.33-0.333-34.8 MG Chew Chew 3 each by mouth daily.        Discharge home in stable condition Infant Feeding: Breast Infant Disposition:home with mother Discharge instruction: per After Visit Summary and Postpartum booklet. Activity: Advance as tolerated. Pelvic rest for 6 weeks.  Diet: routine diet Future Appointments: Future Appointments  Date Time Provider [place]  08/21/2020  8:15 AM Constant, Vickii Chafe, MD [place] None   Follow up Visit:  Follow-up Information    [place] Follow up on 08/21/2020.   Specialty: Obstetrics and Gynecology Why: for postpartum checkup Contact  information: [place] [phone]               Please schedule this patient for a In person postpartum visit in 6 weeks with the following provider: Any provider. Additional Postpartum F/U:Colpo (LSIL/+HPV-) at 6 week visit  Low risk pregnancy complicated by: none Delivery mode:  Vaginal, Spontaneous  Anticipated Birth Control:  PP Nexplanon placed    07/11/2020 Christin Fudge, CNM

## 2020-07-09 NOTE — H&P (Addendum)
Obstetric Admission History & Physical  Subjective Leah Molina is a 34 y.o. female [phone] with IUP at [redacted]w[redacted]d by LMP admitted for induction of labor due to Post dates. Due date 07/02/20..  Reports fetal movement. Denies vaginal bleeding and ROM. She received her prenatal care at  [facility] .  Support person in labor: FOB, Cesar   Ultrasounds  19+5, consistent biometry, incomplete anatomy    23+5, complete anatomy, EFW 48%ile   Prenatal History/Complications:  Hx of 4.136kg baby, SVD (G4)  LGSIL, + HPV on pap, needs pp f/u     Past Medical History:  Diagnosis Date   Medical history non-contributory    Past Surgical History:  Procedure Laterality Date   NO PAST SURGERIES     OB History     Gravida  5   Para  3   Term  3   Preterm  0   AB  1   Living  3      SAB      TAB  1   Ectopic      Multiple  0   Live Births  3          Social History   Socioeconomic History   Marital status: Significant Other    Spouse name: Personal assistant   Number of children: 3   Years of education: Not on file   Highest education level: Some college, no degree  Occupational History   Occupation: Retail  Tobacco Use   Smoking status: Never Smoker   Smokeless tobacco: Never Used  Building services engineer Use: Never used  Substance and Sexual Activity   Alcohol use: Never   Drug use: Never   Sexual activity: Yes    Birth control/protection: None  Other Topics Concern   Not on file  Social History Narrative   Not on file   Social Determinants of Health   Financial Resource Strain:    Difficulty of Paying Living Expenses: Not on file  Food Insecurity:    Worried About Programme researcher, broadcasting/film/video in the Last Year: Not on file   [employer] of Food in the Last Year: Not on file  Transportation Needs:    Lack of Transportation (Medical): Not on file   Lack of Transportation (Non-Medical): Not on file  Physical Activity:    Days of Exercise per Week: Not on file    Minutes of Exercise per Session: Not on file  Stress:    Feeling of Stress : Not on file  Social Connections:    Frequency of Communication with Friends and Family: Not on file   Frequency of Social Gatherings with Friends and Family: Not on file   Attends Religious Services: Not on file   Active Member of Clubs or Organizations: Not on file   Attends Banker Meetings: Not on file   Marital Status: Not on file   History reviewed. No pertinent family history. Allergies: No Known Allergies Medications:  No outpatient medications have been marked as taking for the 07/09/20 encounter [facility] Encounter).    Review of Systems  All systems reviewed and negative except as stated in HPI  Objective Physical Exam:  BP 128/69    Pulse (!) 101    Temp 98.1 F (36.7 C) (Oral)    Resp 18    Ht 5\' 7"  (1.702 m)    Wt 104.3 kg    LMP 09/26/2019 (Exact Date)    BMI 36.02 kg/m  General appearance:  alert, cooperative and appears stated age Lungs: no respiratory distress Heart: RRR. No BLEE.  Abdomen: soft, non-tender; gravid  Extremities: Moving spontaneously, warm, well perfused. 2+ DP.  Uterine activity: no contraction pain Cervical Exam:    Presentation: cephalic by RN exam  Fetal monitoring: baseline 135 / mod variability/ +a / none d  Prenatal labs: ABO, Rh: O/Positive/-- (04/19 1402) Antibody: Negative (04/19 1402) Rubella: 14.90 (04/19 1402) RPR: Non Reactive (07/29 0844)  HBsAg: Negative (04/19 1402)  HIV: Non Reactive (07/29 0844)  GBS: Negative/-- (09/30 1533)  Glucola: Fasting 87 - 149/115 Genetic screening:  LR female   Prenatal Transfer Tool  Maternal Diabetes: No Genetic Screening: Normal Maternal Ultrasounds/Referrals: Normal Fetal Ultrasounds or other Referrals:  None Maternal Substance Abuse:  No Significant Maternal Medications:  None Significant Maternal Lab Results: Group B Strep negative  No results found for this or any previous visit  (from the past 24 hour(s)).  Patient Active Problem List   Diagnosis Date Noted   Post-dates pregnancy 07/09/2020   Supervision of other normal pregnancy, antepartum 12/27/2019    Assessment & Plan:  Leah Molina is a 35 y.o. [id number] at 108w0d admitted for IOL for PD   Labor:  Discussed IOL process with patient and she understands and is agreeable. Cytotec buccal x 1 at 1255.    Pain control:  per pt preference  Anticipated MOD: NSVD  Fetal Wellbeing: Category I  GBS Negative/-- (09/30 1533)   Continuous fetal monitoring  Postpartum Planning  Boy(yes)/breast/IP nexplanon   Rubella immune, Tdap provided during [facility]   Genia Hotter, M.D.  Family Medicine   PGY-3 07/09/2020 12:44 PM  I saw and evaluated the patient. I agree with the findings and the plan of care as documented in the residents note.  Casper Harrison, MD [facility] Family Medicine Fellow, [facility] for [facility], [facility]

## 2020-07-09 NOTE — Progress Notes (Signed)
Labor Progress Note  Subjective:  Patient feeling more contractions, mild pain.   Objective:  BP 120/66   Pulse 81   Temp 98.1 F (36.7 C) (Oral)   Resp 18   Ht 5\' 7"  (1.702 m)   Wt 104.3 kg   LMP 09/26/2019 (Exact Date)   BMI 36.02 kg/m  Cervical: 4/80/-2, vertex w/ bulging bag  Toco: regular q2-5minutes FH: BL 140, moderate var, + a, no decels.   Assessment and Plan:  Leah Molina is a 34 y.o. 34 at [redacted]w[redacted]d admitted for IOL for PD   Labor:  Buccal cytotec x 1 1255. Start pitocin titration 2x2.   Pain control:  per pt preference  Anticipated MOD: NSVD  Fetal Wellbeing: Category I  GBS Negative/-- (09/30 1533)   Continuous fetal monitoring  01-09-2006, M.D.  FM PGY 3 07/09/2020 4:52 PM

## 2020-07-10 ENCOUNTER — Telehealth: Payer: Self-pay | Admitting: General Practice

## 2020-07-10 LAB — RPR: RPR Ser Ql: NONREACTIVE

## 2020-07-10 NOTE — Telephone Encounter (Signed)
-----   Message from Gita Kudo, MD sent at 07/09/2020  7:58 PM EDT ----- Regarding: Postpartum Visit Follow up Visit:   Please schedule this patient for a In person postpartum visit in 6 weeks with the following provider: Any provider. Additional Postpartum F/U:Colpo (LSIL/+HPV-) at 6 week visit  Low risk pregnancy complicated by: none Delivery mode:  Vaginal, Spontaneous  Anticipated Birth Control:  PP Nexplanon placed    07/09/2020 Gita Kudo, MD

## 2020-07-10 NOTE — Progress Notes (Signed)
Post Partum Day 1 Subjective: no complaints, up ad lib, voiding and tolerating PO  Objective: Blood pressure 110/72, pulse 67, temperature 97.9 F (36.6 C), temperature source Axillary, resp. rate 18, height 5\' 7"  (1.702 m), weight 104.3 kg, last menstrual period 09/26/2019, unknown if currently breastfeeding.  Physical Exam:  General: alert and no distress Lochia: appropriate Uterine Fundus: firm Incision: n/a DVT Evaluation: No evidence of DVT seen on physical exam. No cords or calf tenderness. No significant calf/ankle edema.  Recent Labs    07/09/20 1406  HGB 12.8  HCT 38.6    Assessment/Plan: Plan for discharge tomorrow and Breastfeeding Considering Nexplanon vs IUD   LOS: 1 day   07/11/20 07/10/2020, 6:35 AM

## 2020-07-10 NOTE — Telephone Encounter (Signed)
Informed patient via Mychart of Postpartum visit with Colpscopy at [facility].  Pt read [facility] message, therefore she is aware of appointment.

## 2020-07-11 MED ORDER — IBUPROFEN 600 MG PO TABS: 600.0000 mg | ORAL_TABLET | Freq: Four times a day (QID) | ORAL | 0 refills | Status: AC

## 2020-07-11 NOTE — Lactation Note (Signed)
This note was copied from a baby's chart. Lactation Consultation Note  Patient Name: Leah Molina Date: 07/11/2020   Infant drank 25 mL of DBM & then, later, 15 mL of EBM. Mom verbalized that she is also willing to give formula once she goes home.   Mom was more comfortable with the size 24 flange than the size 27 flange. Mom says she knows how to wash breast pump parts, etc.   Mom has our breastfeeding brochure which mentions our O/P services, breastfeeding support groups, and our phone # for post-discharge questions.    Plan: -Offer breast, then bottle with EBM/formula. -Pump after feedings.  [name] [facility] 07/11/2020, 12:38 PM

## 2020-07-11 NOTE — Lactation Note (Addendum)
This note was copied from a baby's chart. Lactation Consultation Note  Patient Name: Leah Molina [id number] Date: 07/11/2020   Infant is 58 hrs old. Infant did not have 1st void until 27 hrs of life & 1st stool until 32 hrs of life.  When I entered the room, the infant was cueing to be fed, but parents responded that he had just finished feeding. As some long feedings had been noted, I encouraged Leah Molina to relatch so we could determine frequency of swallows.   Infant latched with relative ease, but swallows were infrequent, only about q 10-16 sucks. I shared with parents that this was not enough swallowing and that swallowing at this frequency may be reflective of infant swallowing her own saliva. I suggested that we supplement with formula & Leah Molina declined. I mentioned using DBM if infant isn't discharged and needs to spend another night. Leah Molina responded that they need to go home because of their 3 other children.  I made Dr. Viann Fish aware of the above. I took in a vial for Leah Molina to do hand expression & a hand pump (a size 27 flange was also provided: Leah Molina will determine which flange size is best for her). Leah Molina declined needing any assistance with hand expression. I asked Leah Molina to call out once she is done with pumping and hand expression.   While writing this note, Leah Friends, RN called me. She may go ahead and offer DBM since the infant's serum bili level has not yet returned & infant has not been seen by pediatrician.   [name] [facility] 07/11/2020, 10:16 AM

## 2020-07-11 NOTE — Discharge Instructions (Signed)

## 2020-07-19 DIAGNOSIS — Z8759 Personal history of other complications of pregnancy, childbirth and the puerperium: Secondary | ICD-10-CM

## 2020-08-21 ENCOUNTER — Ambulatory Visit: Payer: 59 | Admitting: Obstetrics and Gynecology

## 2020-08-30 ENCOUNTER — Encounter: Payer: Self-pay | Admitting: Obstetrics

## 2020-08-30 ENCOUNTER — Other Ambulatory Visit: Payer: Self-pay

## 2020-08-30 ENCOUNTER — Ambulatory Visit (INDEPENDENT_AMBULATORY_CARE_PROVIDER_SITE_OTHER): Payer: 59 | Admitting: Obstetrics

## 2020-08-30 DIAGNOSIS — Z30011 Encounter for initial prescription of contraceptive pills: Secondary | ICD-10-CM

## 2020-08-30 DIAGNOSIS — Z3009 Encounter for other general counseling and advice on contraception: Secondary | ICD-10-CM

## 2020-08-30 MED ORDER — NORETHINDRONE 0.35 MG PO TABS: 1.0000 | ORAL_TABLET | Freq: Every day | ORAL | 11 refills | Status: AC

## 2020-08-30 NOTE — Progress Notes (Signed)
Post Partum Visit Note  Leah Molina is a 34 y.o. [id number] female who presents for a postpartum visit. She is 6 weeks postpartum following a normal spontaneous vaginal delivery.  I have fully reviewed the prenatal and intrapartum course. The delivery was at 41.0 gestational weeks.  Anesthesia: none. Postpartum course has been unremarkable. Baby is doing well. Baby is feeding by breast. Bleeding no bleeding. Bowel function is normal. Bladder function is normal. Patient is not sexually active. Contraception method is none she wants Paragard. Postpartum depression screening: negative=0.   The pregnancy intention screening data noted above was reviewed. Potential methods of contraception were discussed. The patient elected to proceed with Oral Contraceptive.    Edinburgh Postnatal Depression Scale - 08/30/20 1332      Edinburgh Postnatal Depression Scale:  In the Past 7 Days   I have been able to laugh and see the funny side of things. 0    I have looked forward with enjoyment to things. 0    I have blamed myself unnecessarily when things went wrong. 0    I have been anxious or worried for no good reason. 0    I have felt scared or panicky for no good reason. 0    Things have been getting on top of me. 0    I have been so unhappy that I have had difficulty sleeping. 0    I have felt sad or miserable. 0    I have been so unhappy that I have been crying. 0    The thought of harming myself has occurred to me. 0    Edinburgh Postnatal Depression Scale Total 0            The following portions of the patient's history were reviewed and updated as appropriate: allergies, current medications, past family history, past medical history, past social history, past surgical history and problem list.  Review of Systems A comprehensive review of systems was negative.    Objective:  BP 93/60   Pulse 69   Wt 208 lb (94.3 kg)   Breastfeeding Yes   BMI 32.58 kg/m    General:  alert and no  distress   Breasts:  inspection negative, no nipple discharge or bleeding, no masses or nodularity palpable  Lungs: clear to auscultation bilaterally  Heart:  regular rate and rhythm, S1, S2 normal, no murmur, click, rub or gallop      Assessment:   1. Postpartum care following vaginal delivery  2. Encounter for other general counseling and advice on contraception - wants ParaGuard IUD, but will take Micronor until IUD insertion  3. Encounter for initial prescription of contraceptive pills Rx: - norethindrone (MICRONOR) 0.35 MG tablet; Take 1 tablet (0.35 mg total) by mouth daily.  Dispense: 28 tablet; Refill: 11  4. LGSIL pap smear - schedule colposcopy in 6 weeks   Plan:   Essential components of care per ACOG recommendations:  1.  Mood and well being: Patient with negative depression screening today. Reviewed local resources for support.  - Patient does not use tobacco.  - hx of drug use? No    2. Infant care and feeding:  -Patient currently breastmilk feeding? Yes If breastmilk feeding discussed return to work and pumping. If needed, patient was provided letter for work to allow for every 2-3 hr pumping breaks, and to be granted a private location to express breastmilk and refrigerated area to store breastmilk. Reviewed importance of draining breast regularly to support lactation. -  Social determinants of health (SDOH) reviewed in EPIC. No concerns  3. Sexuality, contraception and birth spacing - Patient does not want a pregnancy in the next year.  Desired family size is 4 children.  - Reviewed forms of contraception in tiered fashion. Patient desired Micronor today.  Wants ParaGuard IUD later - Discussed birth spacing of 18 months  4. Sleep and fatigue -Encouraged family/partner/community support of 4 hrs of uninterrupted sleep to help with mood and fatigue  5. Physical Recovery  - Discussed patients delivery, and there were no complications - Patient had no  lacerations,  - Patient has urinary incontinence? No - Patient is safe to resume physical and sexual activity  6.  Health Maintenance - Last pap smear done 01-26-2020 and was LGSIL with positive HRHPV   7. No Chronic Disease  Charles A. Clearance Coots MD [facility] for [employer], [facility] 08/30/2020 2:58 PM

## 2020-10-11 ENCOUNTER — Ambulatory Visit: Payer: 59 | Admitting: Obstetrics

## 2020-11-10 IMAGING — US US MFM OB DETAIL+14 WK
1 series · 13 of 28 positions shown · non-contrast
Comparison: none

[Series 1: us mfm ob detail+14 wk · 100 acquisitions, 13 frames shown]
[im 4/100]
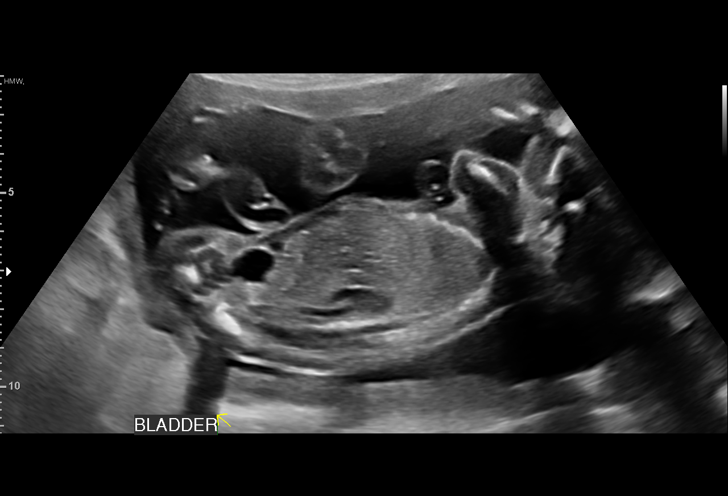
[im 12/100]
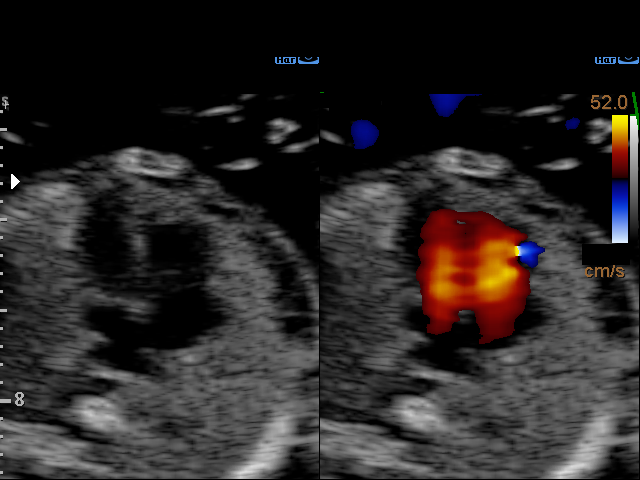
[im 19/100]
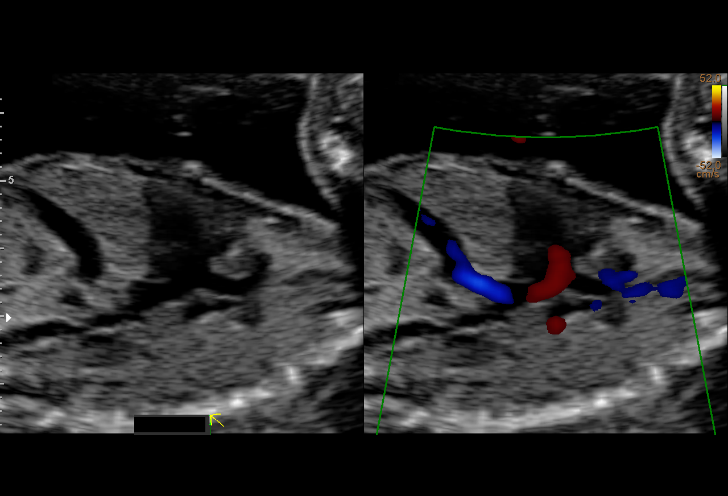
[im 26/100]
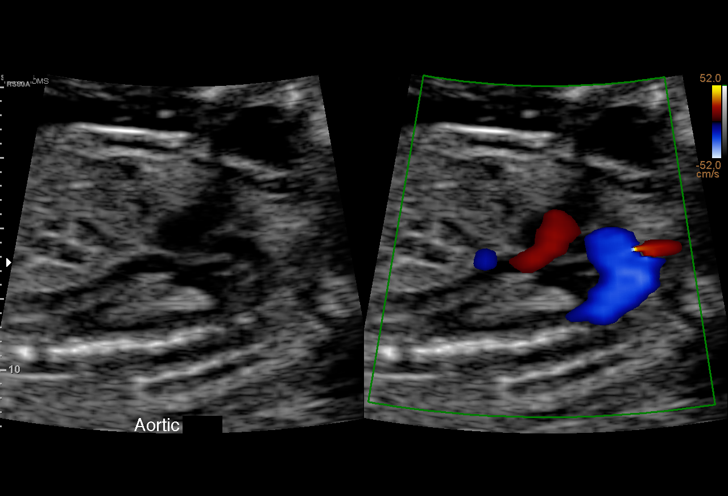
[im 34/100]
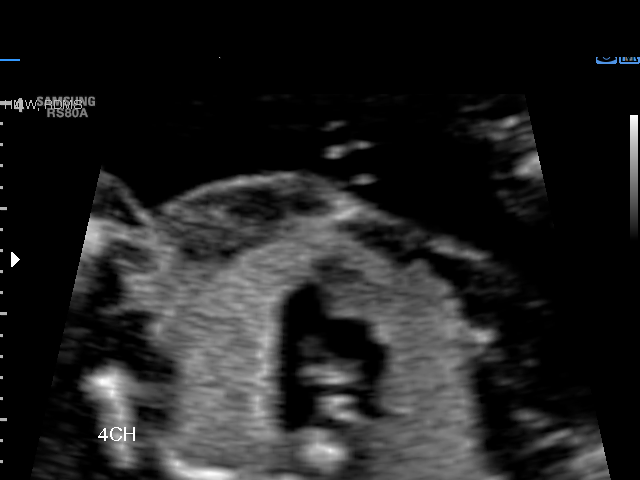
[im 41/100]
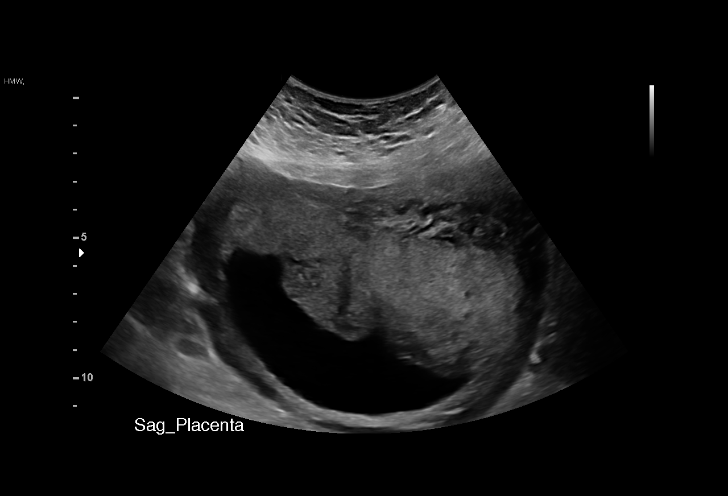
[im 52/100]
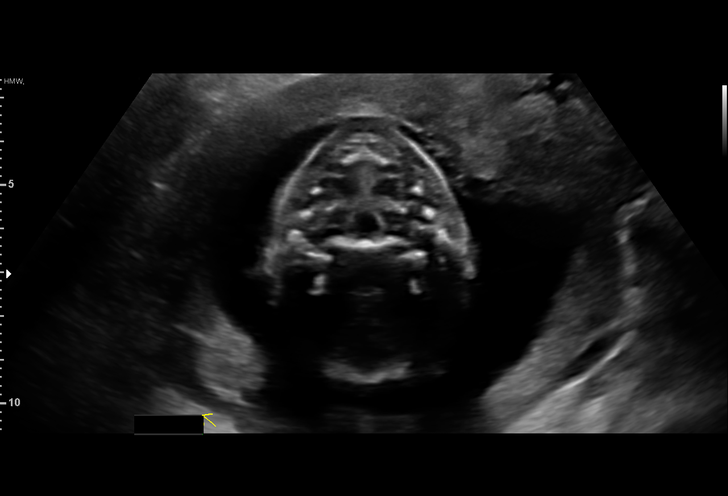
[im 59/100]
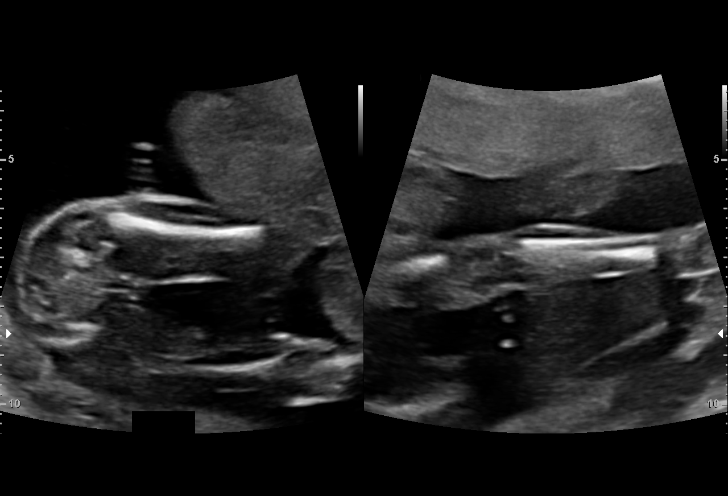
[im 67/100]
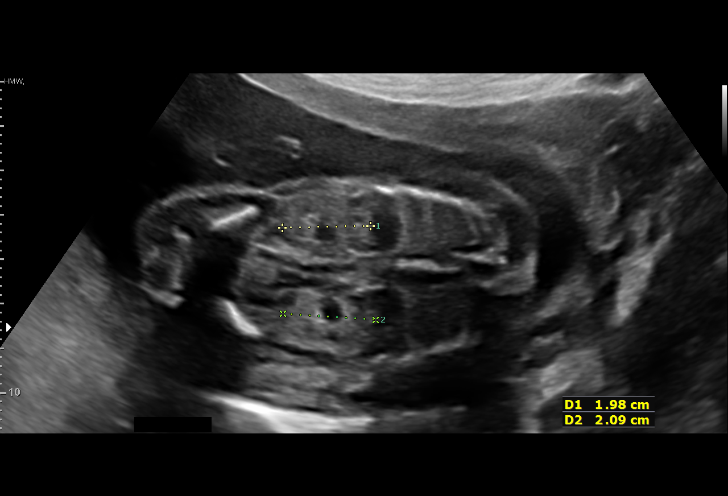
[im 74/100]
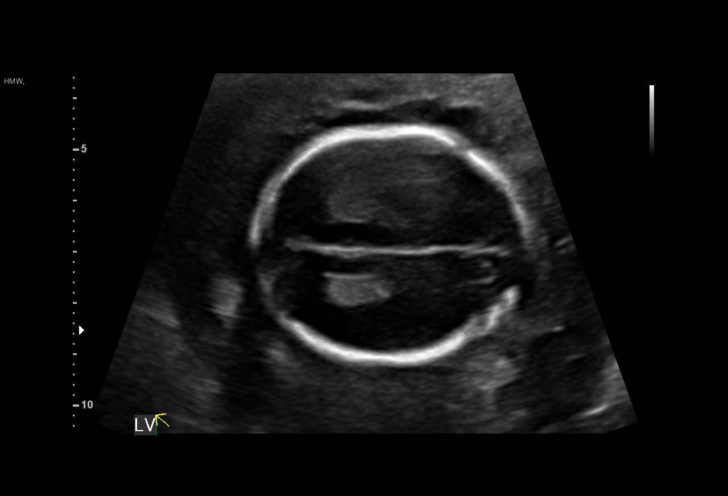
[im 81/100]
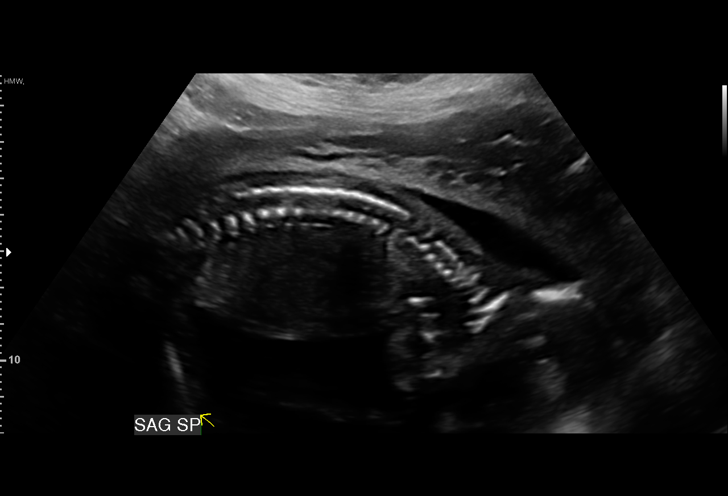
[im 89/100]
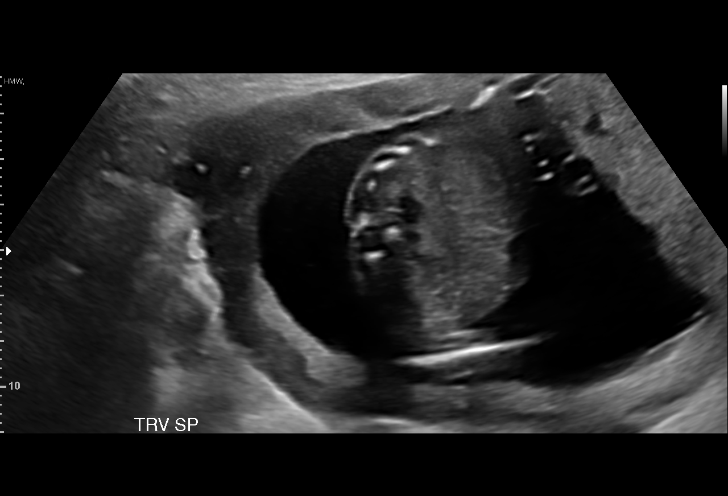
[im 96/100]
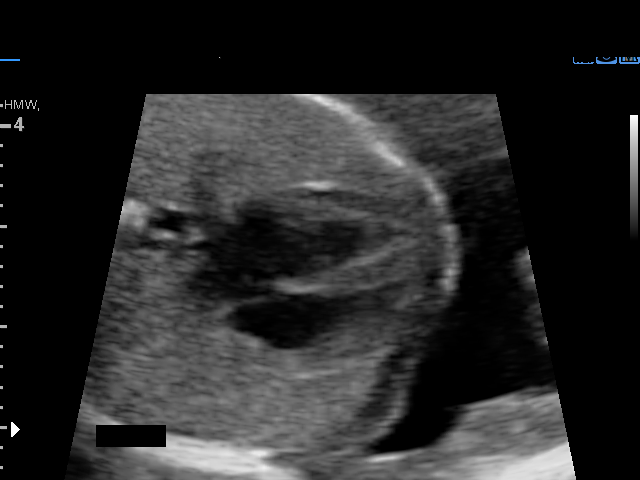

[13 of 28 positions shown; findings below may reference images not displayed]

Indications

 19 weeks gestation of pregnancy
 Encounter for antenatal screening for
 malformations
 Low risk NIPS, X.QUU, neg Horizon
 Obesity complicating pregnancy, second
 trimester (pregravid BMI 31.94)
Fetal Evaluation

 Num Of Fetuses:         1
 Fetal Heart Rate(bpm):  147
 Cardiac Activity:       Observed
 Presentation:           Cephalic
 Placenta:               Anterior
 P. Cord Insertion:      Visualized, central

 Amniotic Fluid
 AFI FV:      Within normal limits

                             Largest Pocket(cm)

Biometry

 BPD:      46.3  mm     G. Age:  20w 0d         62  %    CI:        78.49   %    70 - 86
                                                         FL/HC:      19.8   %    16.8 -
 HC:      165.3  mm     G. Age:  19w 2d         21  %    HC/AC:      1.11        1.09 -
 AC:      148.9  mm     G. Age:  20w 1d         60  %    FL/BPD:     70.6   %
 FL:       32.7  mm     G. Age:  20w 1d         61  %    FL/AC:      22.0   %    20 - 24
 HUM:      31.5  mm     G. Age:  20w 4d         73  %
 CER:      20.3  mm     G. Age:  19w 2d         42  %
 NFT:       2.8  mm
 CM:        5.3  mm

 Est. FW:     331  gm    0 lb 12 oz      67  %
OB History

 Gravidity:    5         Term:   3        Prem:   0        SAB:   1
 TOP:          0       Ectopic:  0        Living: 3
Gestational Age

 LMP:           19w 5d        Date:  09/26/19                 EDD:   07/02/20
 U/S Today:     19w 6d                                        EDD:   07/01/20
 Best:          19w 5d     Det. By:  LMP  (09/26/19)          EDD:   07/02/20
Anatomy

 Cranium:               Appears normal         LVOT:                   Appears normal
 Cavum:                 Appears normal         Aortic Arch:            Appears normal
 Ventricles:            Appears normal         Ductal Arch:            Appears normal
 Choroid Plexus:        Appears normal         Diaphragm:              Appears normal
 Cerebellum:            Appears normal         Stomach:                Appears normal, left
                                                                       sided
 Posterior Fossa:       Appears normal         Abdomen:                Appears normal
 Nuchal Fold:           Appears normal         Abdominal Wall:         Appears nml (cord
                                                                       insert, abd wall)
 Face:                  Orbits nl; profile not Cord Vessels:           Appears normal (3
                        well visualized                                vessel cord)
 Lips:                  Appears normal         Kidneys:                Appear normal
 Palate:                Appears normal         Bladder:                Appears normal
 Thoracic:              Appears normal         Spine:                  Appears normal
 Heart:                 Appears normal         Upper Extremities:      Appears normal
                        (4CH, axis, and
                        situs)
 RVOT:                  Appears normal         Lower Extremities:      Appears normal

 Other:  Fetus appears to be female. Heels visualized. Technically difficult due
         to fetal position. 3VV and 3VTV visualized.
Cervix Uterus Adnexa

 Cervix
 Length:            3.2  cm.
 Normal appearance by transabdominal scan.

 Uterus
 No abnormality visualized.

 Right Ovary
 No adnexal mass visualized.

 Left Ovary
 No adnexal mass visualized.

 Cul De Sac
 No free fluid seen.

 Adnexa
 No abnormality visualized.
Impression

 We performed fetal anatomy scan. No makers of
 aneuploidies or fetal structural defects are seen. Fetal
 biometry is consistent with her previously-established dates.
 Amniotic fluid is normal and good fetal activity is seen.
 Patient understands the limitations of ultrasound in detecting
 fetal anomalies.

 On cell-free fetal DNA screening, the risks of fetal
 aneuploidies are not increased .
Recommendations

 -An appointment was made for her to return in 4 weeks for
 completion of fetal anatomy (fetal profile).
                 [place], [place]

## 2020-12-08 IMAGING — US US MFM OB FOLLOW-UP
1 series · 14 of 28 positions shown · non-contrast
Comparison: none

[Series 1: us mfm ob follow-up · 14 of 63 slices shown]
[im 3/63]
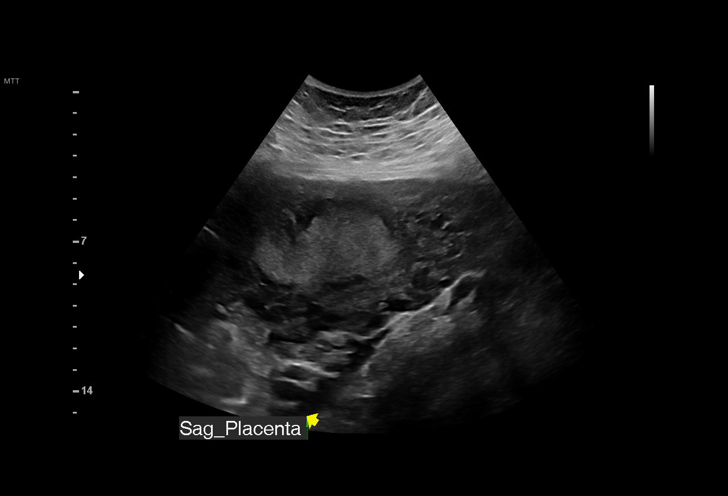
[im 7/63]
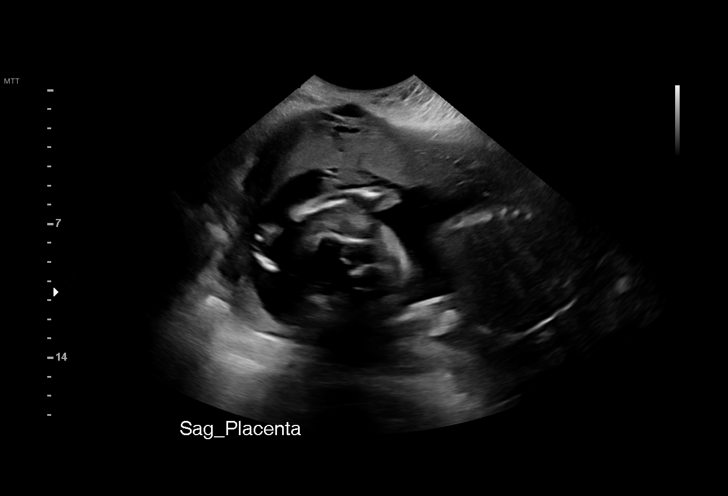
[im 12/63]
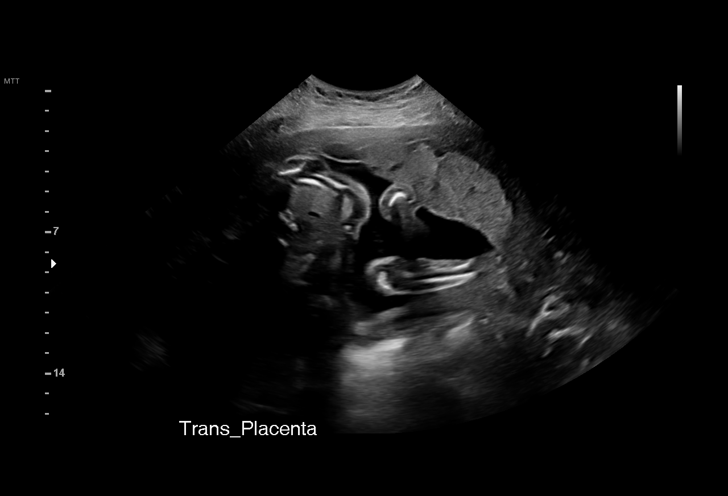
[im 17/63]
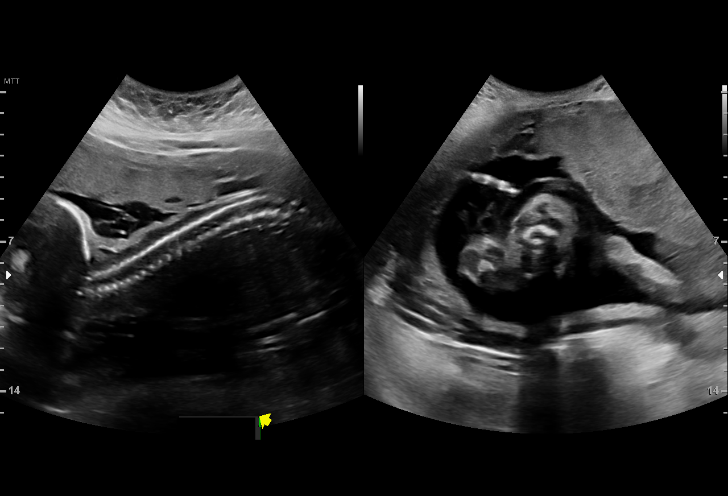
[im 21/63]
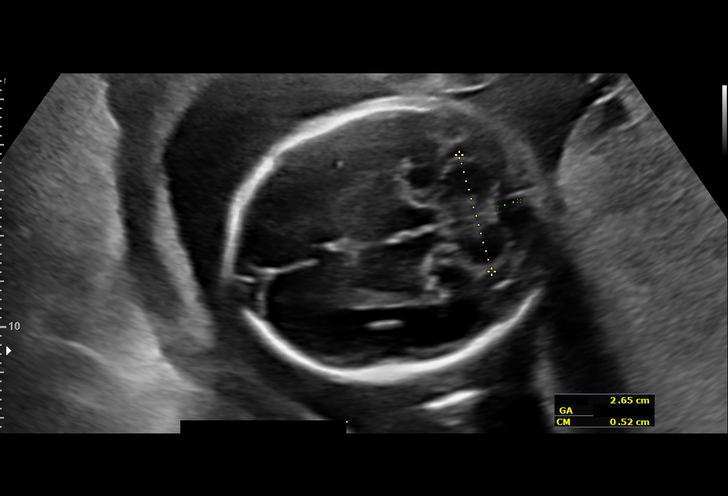
[im 26/63]
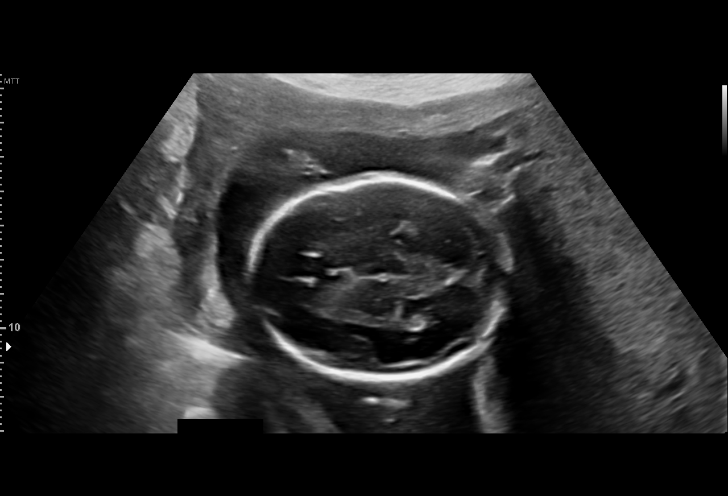
[im 30/63]
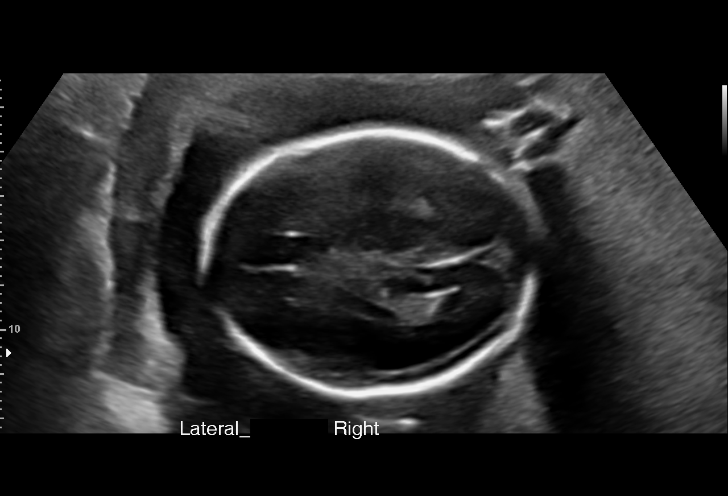
[im 35/63]
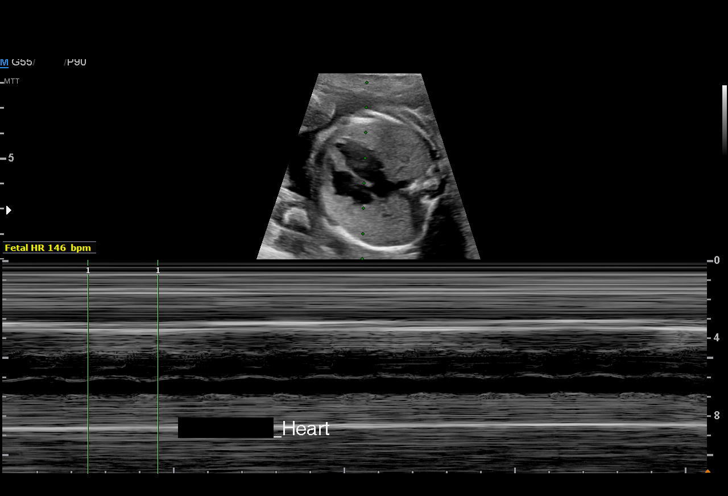
[im 40/63]
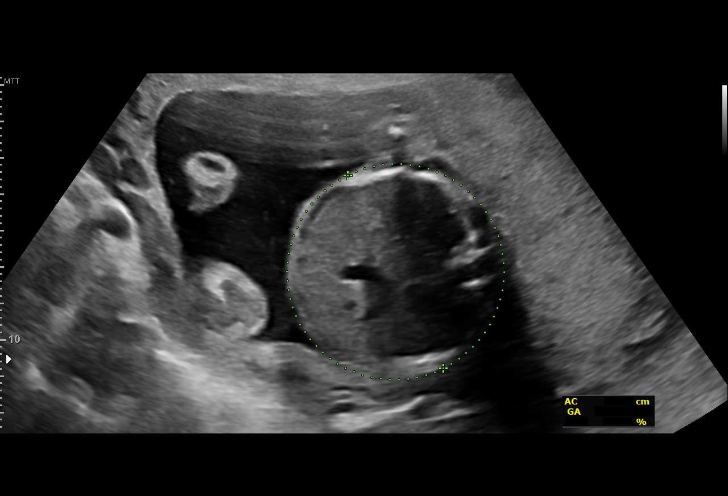
[im 44/63]
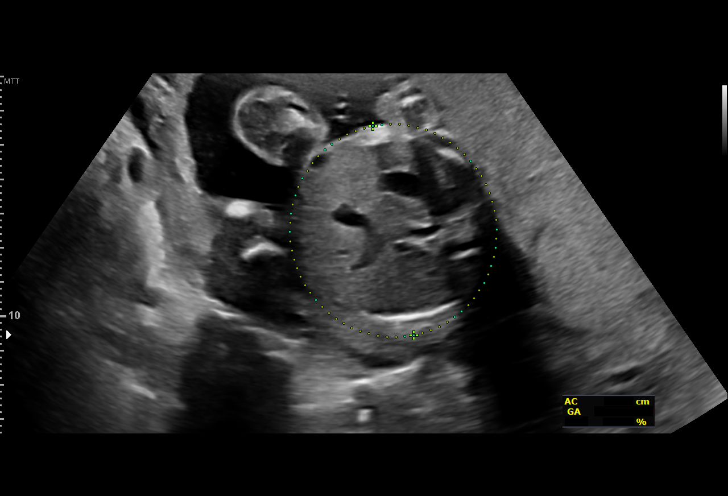
[im 49/63]
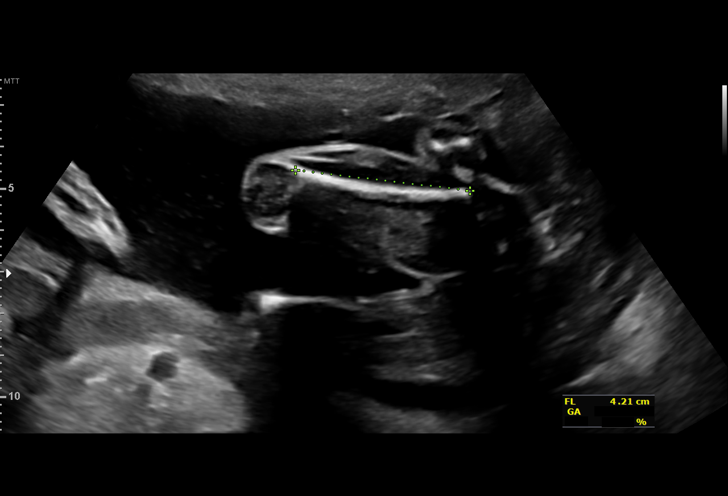
[im 53/63]
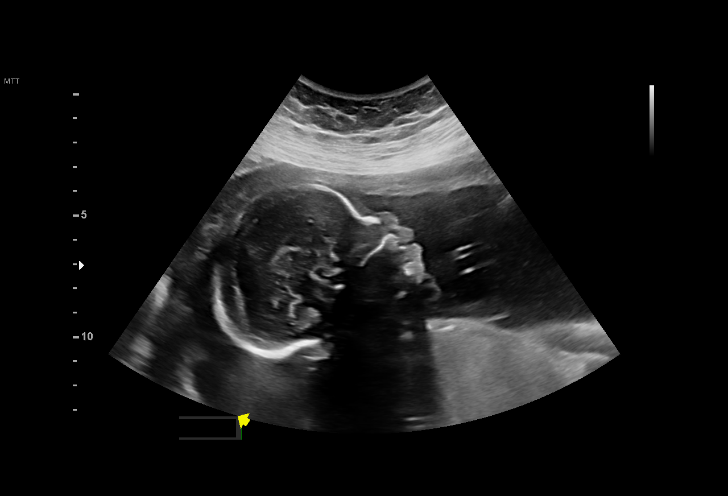
[im 58/63]
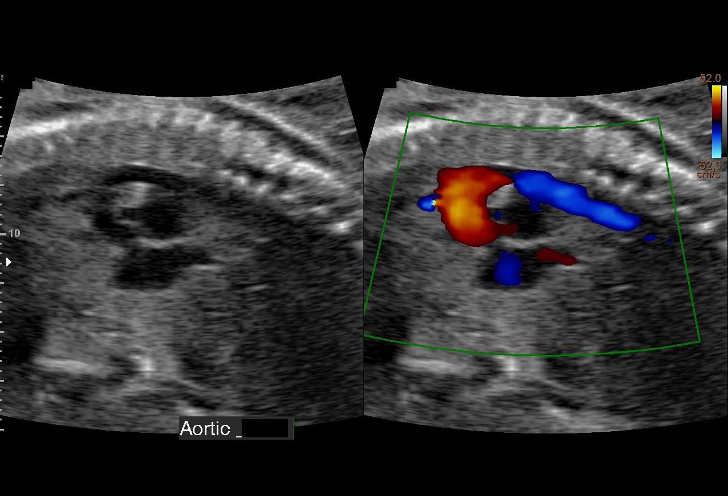
[im 63/63]
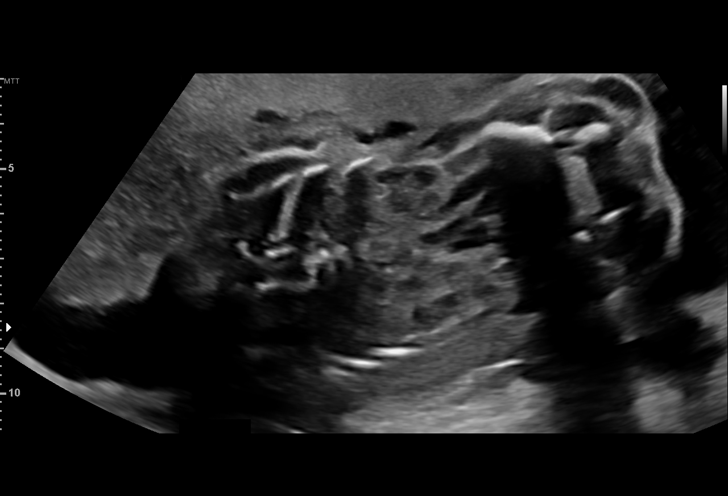

[14 of 28 positions shown; findings below may reference images not displayed]

Indications

 23 weeks gestation of pregnancy
 Low risk NIPS, 5.XRR, neg Horizon
 Obesity complicating pregnancy, second
 trimester (pregravid BMI 31.94)
 Encounter for other antenatal screening
 follow-up
 Antenatal follow-up for nonvisualized fetal
 anatomy
Fetal Evaluation

 Num Of Fetuses:          1
 Fetal Heart Rate(bpm):   146
 Cardiac Activity:        Observed
 Presentation:            Breech
 Placenta:                Anterior
 P. Cord Insertion:       Previously Visualized

 Amniotic Fluid
 AFI FV:      Within normal limits

                             Largest Pocket(cm)


 RUQ(cm)

Biometry

 BPD:      57.7  mm     G. Age:  23w 5d         42  %    CI:        73.52   %    70 - 86
                                                         FL/HC:       20.1  %    18.7 -
 HC:      213.8  mm     G. Age:  23w 3d         23  %    HC/AC:       1.12       1.05 -
 AC:      190.7  mm     G. Age:  23w 6d         44  %    FL/BPD:      74.4  %    71 - 87
 FL:       42.9  mm     G. Age:  24w 0d         48  %    FL/AC:       22.5  %    20 - 24
 CER:      26.5  mm     G. Age:  23w 6d         70  %

 LV:        6.3  mm
 CM:        5.2  mm

 Est. FW:     635   gm     1 lb 6 oz     48  %
OB History

 Gravidity:    5         Term:   3        Prem:   0        SAB:   1
 TOP:          0       Ectopic:  0        Living: 3
Gestational Age

 LMP:           23w 5d        Date:  09/26/19                 EDD:   07/02/20
 U/S Today:     23w 5d                                        EDD:   07/02/20
 Best:          23w 5d     Det. By:  LMP  (09/26/19)          EDD:   07/02/20
Anatomy

 Cranium:               Appears normal         LVOT:                   Appears normal
 Cavum:                 Appears normal         Aortic Arch:            Appears normal
 Ventricles:            Appears normal         Ductal Arch:            Previously seen
 Choroid Plexus:        Previously seen        Diaphragm:              Appears normal
 Cerebellum:            Appears normal         Stomach:                Previously Seen
 Posterior Fossa:       Previously seen        Abdomen:                Previously seen
 Nuchal Fold:           Previously seen        Abdominal Wall:         Previously seen
 Face:                  Orbits nl prev;        Cord Vessels:           Previously seen
                        profile not well vis.
 Lips:                  Previously seen        Kidneys:                Appear normal
 Palate:                Previously seen        Bladder:                Appears normal
 Thoracic:              Previously seen        Spine:                  Appears normal
 Heart:                 Appears normal         Upper Extremities:      Previously seen
                        (4CH, axis, and
                        situs)
 RVOT:                  Appears normal         Lower Extremities:      Previously seen

 Other:  Female gender previously seen. Heels visualized previously. 3VV and
         3VTV visualized previously. Technically difficult due to fetal position.
Cervix Uterus Adnexa

 Cervix
 Length:           4.32  cm.
 Normal appearance by transabdominal scan.

 Uterus
 No abnormality visualized.

 Right Ovary
 No adnexal mass visualized.

 Left Ovary
 Within normal limits. No adnexal mass visualized.

 Cul De Sac
 No free fluid seen.
 Adnexa
 No abnormality visualized.
Impression

 Follow up growth and anatomy
 Normal interval growth with good fetal movement and
 amniotic fluid.
 Anatomy completed.
Recommendations

 Follow up as clinically indicated.

## 2021-09-07 ENCOUNTER — Other Ambulatory Visit (HOSPITAL_COMMUNITY)
Admission: RE | Admit: 2021-09-07 | Discharge: 2021-09-07 | Disposition: A | Discharge status: Home / Self Care | Payer: Medicaid Other | Source: Ambulatory Visit | Attending: Obstetrics & Gynecology | Admitting: Obstetrics & Gynecology

## 2021-09-07 ENCOUNTER — Ambulatory Visit (INDEPENDENT_AMBULATORY_CARE_PROVIDER_SITE_OTHER): Payer: Medicaid Other | Admitting: Obstetrics & Gynecology

## 2021-09-07 ENCOUNTER — Other Ambulatory Visit: Payer: Self-pay

## 2021-09-07 VITALS — BP 130/82 | HR 85 | Ht 67.0 in | Wt 212.4 lb

## 2021-09-07 DIAGNOSIS — Z01419 Encounter for gynecological examination (general) (routine) without abnormal findings: Secondary | ICD-10-CM | POA: Diagnosis present

## 2021-09-07 DIAGNOSIS — R87612 Low grade squamous intraepithelial lesion on cytologic smear of cervix (LGSIL): Secondary | ICD-10-CM | POA: Insufficient documentation

## 2021-09-07 NOTE — Progress Notes (Signed)
Annual Last pap 01/2020 HR HPV, LGSIL Wants IUD, will schedule another visit

## 2021-09-07 NOTE — Progress Notes (Signed)
Patient ID: Leah Molina, female   DOB: 1986/04/20, 35 y.o.   MRN: [id number]  Chief Complaint  Patient presents with   Gynecologic Exam    HPI Leah Molina is a 35 y.o. female.  [id number] No LMP recorded (lmp unknown). She is still nursing and has not resumed menses postpartum. Will schedule IUP insertion and prefers Paragard. She did not return for colposcopy for LSIL and will repeat pap today.  HPI  Past Medical History:  Diagnosis Date   Medical history non-contributory     Past Surgical History:  Procedure Laterality Date   NO PAST SURGERIES      No family history on file.  Social History Social History   Tobacco Use   Smoking status: Never   Smokeless tobacco: Never  Vaping Use   Vaping Use: Never used  Substance Use Topics   Alcohol use: Never   Drug use: Never    No Known Allergies  Current Outpatient Medications  Medication Sig Dispense Refill   ibuprofen (ADVIL) 600 MG tablet Take 1 tablet (600 mg total) by mouth every 6 (six) hours. (Patient not taking: Reported on 08/30/2020) 30 tablet 0   norethindrone (MICRONOR) 0.35 MG tablet Take 1 tablet (0.35 mg total) by mouth daily. (Patient not taking: Reported on 09/07/2021) 28 tablet 11   Prenatal Vit-Fe Phos-FA-Omega (VITAFOL GUMMIES) 3.33-0.333-34.8 MG CHEW Chew 3 each by mouth daily. (Patient not taking: Reported on 09/07/2021) 90 tablet 12   No current facility-administered medications for this visit.    Review of Systems Review of Systems  Constitutional: Negative.   Respiratory: Negative.    Cardiovascular: Negative.   Gastrointestinal: Negative.   Genitourinary: Negative.  Negative for menstrual problem.   Blood pressure 130/82, pulse 85, height 5\' 7"  (1.702 m), weight 212 lb 6.4 oz (96.3 kg), currently breastfeeding.  Physical Exam Physical Exam Vitals and nursing note reviewed. Exam conducted with a chaperone present.  Constitutional:      Appearance: She is not ill-appearing.   Pulmonary:     Effort: Pulmonary effort is normal.  Genitourinary:    General: Normal vulva.     Exam position: Lithotomy position.     Vagina: Normal. No vaginal discharge.     Cervix: Normal.  Skin:    General: Skin is warm and dry.  Neurological:     General: No focal deficit present.     Mental Status: She is alert.  Psychiatric:        Mood and Affect: Mood normal.        Behavior: Behavior normal.    Data Reviewed High risk HPV Positive Abnormal    HPV 16 Negative   HPV 18 / 45 Negative   Adequacy Satisfactory for evaluation; transformation zone component PRESENT.   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 Abstain for >2 weeks prior to insertion    09/07/2021, 8:38 AM

## 2021-09-09 DIAGNOSIS — Z419 Encounter for procedure for purposes other than remedying health state, unspecified: Secondary | ICD-10-CM | POA: Diagnosis not present

## 2021-09-13 LAB — CYTOLOGY - PAP
Comment: NEGATIVE
Diagnosis: NEGATIVE
Diagnosis: REACTIVE
High risk HPV: NEGATIVE

## 2021-10-10 ENCOUNTER — Ambulatory Visit: Payer: Medicaid Other | Admitting: Obstetrics

## 2021-10-10 DIAGNOSIS — Z419 Encounter for procedure for purposes other than remedying health state, unspecified: Secondary | ICD-10-CM | POA: Diagnosis not present

## 2021-11-07 DIAGNOSIS — Z419 Encounter for procedure for purposes other than remedying health state, unspecified: Secondary | ICD-10-CM | POA: Diagnosis not present

## 2021-11-08 DIAGNOSIS — H6091 Unspecified otitis externa, right ear: Secondary | ICD-10-CM | POA: Diagnosis not present

## 2021-12-08 DIAGNOSIS — Z419 Encounter for procedure for purposes other than remedying health state, unspecified: Secondary | ICD-10-CM | POA: Diagnosis not present

## 2022-01-07 DIAGNOSIS — Z419 Encounter for procedure for purposes other than remedying health state, unspecified: Secondary | ICD-10-CM | POA: Diagnosis not present

## 2022-02-07 DIAGNOSIS — Z419 Encounter for procedure for purposes other than remedying health state, unspecified: Secondary | ICD-10-CM | POA: Diagnosis not present

## 2022-04-09 DIAGNOSIS — Z419 Encounter for procedure for purposes other than remedying health state, unspecified: Secondary | ICD-10-CM | POA: Diagnosis not present

## 2022-05-10 DIAGNOSIS — Z419 Encounter for procedure for purposes other than remedying health state, unspecified: Secondary | ICD-10-CM | POA: Diagnosis not present

## 2022-06-09 DIAGNOSIS — Z419 Encounter for procedure for purposes other than remedying health state, unspecified: Secondary | ICD-10-CM | POA: Diagnosis not present

## 2022-07-10 DIAGNOSIS — Z419 Encounter for procedure for purposes other than remedying health state, unspecified: Secondary | ICD-10-CM | POA: Diagnosis not present

## 2022-08-09 DIAGNOSIS — Z30430 Encounter for insertion of intrauterine contraceptive device: Secondary | ICD-10-CM | POA: Diagnosis not present

## 2022-09-09 DIAGNOSIS — Z419 Encounter for procedure for purposes other than remedying health state, unspecified: Secondary | ICD-10-CM | POA: Diagnosis not present

## 2022-10-10 DIAGNOSIS — Z419 Encounter for procedure for purposes other than remedying health state, unspecified: Secondary | ICD-10-CM | POA: Diagnosis not present

## 2022-11-08 DIAGNOSIS — Z419 Encounter for procedure for purposes other than remedying health state, unspecified: Secondary | ICD-10-CM | POA: Diagnosis not present

## 2022-12-09 DIAGNOSIS — Z419 Encounter for procedure for purposes other than remedying health state, unspecified: Secondary | ICD-10-CM | POA: Diagnosis not present

## 2023-01-08 DIAGNOSIS — Z419 Encounter for procedure for purposes other than remedying health state, unspecified: Secondary | ICD-10-CM | POA: Diagnosis not present

## 2023-02-08 DIAGNOSIS — Z419 Encounter for procedure for purposes other than remedying health state, unspecified: Secondary | ICD-10-CM | POA: Diagnosis not present

## 2023-03-10 DIAGNOSIS — Z419 Encounter for procedure for purposes other than remedying health state, unspecified: Secondary | ICD-10-CM | POA: Diagnosis not present

## 2023-05-11 DIAGNOSIS — Z419 Encounter for procedure for purposes other than remedying health state, unspecified: Secondary | ICD-10-CM | POA: Diagnosis not present

## 2023-06-10 DIAGNOSIS — Z419 Encounter for procedure for purposes other than remedying health state, unspecified: Secondary | ICD-10-CM | POA: Diagnosis not present

## 2023-08-10 DIAGNOSIS — Z419 Encounter for procedure for purposes other than remedying health state, unspecified: Secondary | ICD-10-CM | POA: Diagnosis not present

## 2023-09-10 DIAGNOSIS — Z419 Encounter for procedure for purposes other than remedying health state, unspecified: Secondary | ICD-10-CM | POA: Diagnosis not present

## 2023-10-11 DIAGNOSIS — Z419 Encounter for procedure for purposes other than remedying health state, unspecified: Secondary | ICD-10-CM | POA: Diagnosis not present

## 2023-11-08 DIAGNOSIS — Z419 Encounter for procedure for purposes other than remedying health state, unspecified: Secondary | ICD-10-CM | POA: Diagnosis not present

## 2023-11-19 DIAGNOSIS — Z419 Encounter for procedure for purposes other than remedying health state, unspecified: Secondary | ICD-10-CM | POA: Diagnosis not present

## 2023-12-20 DIAGNOSIS — Z419 Encounter for procedure for purposes other than remedying health state, unspecified: Secondary | ICD-10-CM | POA: Diagnosis not present

## 2024-01-19 DIAGNOSIS — Z419 Encounter for procedure for purposes other than remedying health state, unspecified: Secondary | ICD-10-CM | POA: Diagnosis not present

## 2024-02-19 DIAGNOSIS — Z419 Encounter for procedure for purposes other than remedying health state, unspecified: Secondary | ICD-10-CM | POA: Diagnosis not present

## 2024-05-27 ENCOUNTER — Ambulatory Visit: Admitting: Physician Assistant

## 2024-05-27 ENCOUNTER — Other Ambulatory Visit (HOSPITAL_COMMUNITY)
Admission: RE | Admit: 2024-05-27 | Discharge: 2024-05-27 | Disposition: A | Discharge status: Home / Self Care | Source: Ambulatory Visit | Attending: Physician Assistant | Admitting: Physician Assistant

## 2024-05-27 ENCOUNTER — Encounter: Payer: Self-pay | Admitting: Physician Assistant

## 2024-05-27 VITALS — BP 96/63 | HR 90 | Ht 67.0 in | Wt 189.2 lb

## 2024-05-27 DIAGNOSIS — Z113 Encounter for screening for infections with a predominantly sexual mode of transmission: Secondary | ICD-10-CM | POA: Insufficient documentation

## 2024-05-27 DIAGNOSIS — Z30432 Encounter for removal of intrauterine contraceptive device: Secondary | ICD-10-CM

## 2024-05-27 DIAGNOSIS — Z01419 Encounter for gynecological examination (general) (routine) without abnormal findings: Secondary | ICD-10-CM | POA: Insufficient documentation

## 2024-05-27 DIAGNOSIS — Z124 Encounter for screening for malignant neoplasm of cervix: Secondary | ICD-10-CM | POA: Diagnosis not present

## 2024-05-27 NOTE — Progress Notes (Signed)
 Annual. Declines PAP today; last one 09/07/2021.  Wants IUD removed; heavy cycles, headaches, recurring yeast. Wishes to discuss tubal ligation with provider.   Scored 5 on PHQ; declined referral.

## 2024-05-27 NOTE — Progress Notes (Signed)
 ANNUAL EXAM Patient name: Leah Molina MRN [id number]  Date of birth: 02-08-1986 Chief Complaint:   No chief complaint on file.  History of Present Illness:   Leah Molina is a 38 y.o. [id number]  female being seen today for a routine annual exam.   Current concerns: would like IUD removal  Patient's last menstrual period was 05/10/2024 (approximate).   The pregnancy intention screening data noted above was reviewed. Potential methods of contraception were discussed. The patient elected to proceed with No data recorded.   Last pap 09/07/21, NILM- HR HPV negative. . H/O abnormal pap: yes LSIL, HPV pos in 2021 Last mammogram: Never previously done due to age. Results were: N/A. Family h/o breast cancer: yes maternal grandmother and two maternal aunts Last colonoscopy: Never previously done due to age. Results were: N/A. Family h/o colorectal cancer: no     05/27/2024    2:37 PM 04/06/2020    8:56 AM 01/26/2020    3:14 PM  Depression screen PHQ 2/9  Decreased Interest 0 0 2  Down, Depressed, Hopeless 0 0 0  PHQ - 2 Score 0 0 2  Altered sleeping 2 1 0  Tired, decreased energy 1 1 2   Change in appetite 1 0 2  Feeling bad or failure about yourself  0 0 0  Trouble concentrating 1 0 0  Moving slowly or fidgety/restless 0 0 0  Suicidal thoughts 0 0 0  PHQ-9 Score 5 2 6   Difficult doing work/chores  Not difficult at all Not difficult at all        05/27/2024    2:38 PM 04/06/2020    8:56 AM 01/26/2020    3:15 PM  GAD 7 : Generalized Anxiety Score  Nervous, Anxious, on Edge 0 0 0  Control/stop worrying 0 0 0  Worry too much - different things 0 0 0  Trouble relaxing 0 0 0  Restless 0 0 0  Easily annoyed or irritable 0 0 1  Afraid - awful might happen 0 0 0  Total GAD 7 Score 0 0 1  Anxiety Difficulty  Not difficult at all Not difficult at all     Review of Systems:   Pertinent items are noted in HPI Denies any headaches, blurred vision, fatigue, shortness of breath,  chest pain, abdominal pain, abnormal vaginal discharge/itching/odor/irritation, problems with periods, bowel movements, urination, or intercourse unless otherwise stated above. Pertinent History Reviewed:  Reviewed past medical,surgical, social and family history.  Reviewed problem list, medications and allergies. Physical Assessment:   Vitals:   05/27/24 1433 05/27/24 1445  BP: (!) 100/56 96/63  Pulse: 86 90  Weight: 189 lb 3.2 oz (85.8 kg)   Height: 5' 7 (1.702 m)   Body mass index is 29.63 kg/m.        Physical Examination:   General appearance - well appearing, and in no distress  Mental status - alert, oriented to person, place, and time  Psych:  She has a normal mood and affect  Skin - warm and dry, normal color, no suspicious lesions noted  Chest - effort normal, all lung fields clear to auscultation bilaterally  Heart - normal rate and regular rhythm  Neck:  midline trachea, no thyromegaly or nodules  Breasts - breasts appear normal, no suspicious masses, no skin or nipple changes or  axillary nodes  Abdomen - soft, nontender, nondistended, no masses or organomegaly  Pelvic - VULVA: normal appearing vulva with no masses, tenderness or lesions  VAGINA:  normal appearing vagina with normal color and discharge, no lesions  CERVIX: normal appearing cervix without discharge or lesions, no CMT  Thin prep pap is done with HR HPV cotesting  UTERUS: uterus is felt to be normal size, shape, consistency and nontender   ADNEXA: No adnexal masses or tenderness noted.  Extremities:  No swelling or varicosities noted  Chaperone present for exam  IUD Removal  Patient identified, informed consent performed, consent signed.  Patient was in the dorsal lithotomy position, normal external genitalia was noted.  A speculum was placed in the patient's vagina, normal discharge was noted, no lesions. The cervix was visualized, no lesions, no abnormal discharge.  The strings of the IUD were grasped  and pulled using ring forceps. The IUD was removed in its entirety. Patient tolerated the procedure well and plans for BTL  Assessment & Plan:  1. Encounter for annual routine gynecological examination (Primary) - Cervical cancer screening: Discussed guidelines. Pap with HPV updated today - GC/CT: accepts - Birth Control: None - Breast Health: Encouraged self breast awareness/SBE. Teaching provided.  - Mammogram: @ 36yo, or sooner if problems - Colonoscopy: @ 36yo, or sooner if problems - Patient to schedule f/u with MD for BTL consult - F/U 12 months and prn  - Cytology - PAP  2. Routine screening for STI (sexually transmitted infection) - Cervicovaginal ancillary only( [place]) - Hepatitis B Surface AntiGEN - Hepatitis C Antibody - HIV antibody (with reflex) - RPR   3. Cervical cancer screening    Orders Placed This Encounter  Procedures   Hepatitis B Surface AntiGEN   Hepatitis C Antibody   HIV antibody (with reflex)   RPR    Meds: No orders of the defined types were placed in this encounter.   Follow-up: Return in about 1 year (around 05/27/2025), or as needed.  Trinia Georgi E Millena Callins, PA-C 05/27/2024 3:41 PM

## 2024-05-28 LAB — CERVICOVAGINAL ANCILLARY ONLY
Bacterial Vaginitis (gardnerella): NEGATIVE
Candida Glabrata: NEGATIVE
Candida Vaginitis: POSITIVE — AB
Chlamydia: NEGATIVE
Comment: NEGATIVE
Comment: NEGATIVE
Comment: NEGATIVE
Comment: NEGATIVE
Comment: NEGATIVE
Comment: NORMAL
Neisseria Gonorrhea: NEGATIVE
Trichomonas: NEGATIVE

## 2024-05-28 LAB — RPR: RPR Ser Ql: NONREACTIVE

## 2024-05-28 LAB — HEPATITIS B SURFACE ANTIGEN: Hepatitis B Surface Ag: NEGATIVE

## 2024-05-28 LAB — HEPATITIS C ANTIBODY: Hep C Virus Ab: NONREACTIVE

## 2024-05-28 LAB — HIV ANTIBODY (ROUTINE TESTING W REFLEX): HIV Screen 4th Generation wRfx: NONREACTIVE

## 2024-05-30 ENCOUNTER — Ambulatory Visit: Payer: Self-pay | Admitting: Physician Assistant

## 2024-05-31 ENCOUNTER — Other Ambulatory Visit: Payer: Self-pay

## 2024-05-31 MED ORDER — FLUCONAZOLE 150 MG PO TABS
150.0000 mg | ORAL_TABLET | Freq: Once | ORAL | 0 refills | Status: AC
Start: 2024-05-31 — End: 2024-05-31

## 2024-06-01 LAB — CYTOLOGY - PAP
Comment: NEGATIVE
Diagnosis: NEGATIVE
Diagnosis: REACTIVE
High risk HPV: NEGATIVE

## 2024-06-11 ENCOUNTER — Encounter: Payer: Self-pay | Admitting: Obstetrics and Gynecology

## 2024-06-11 ENCOUNTER — Ambulatory Visit: Admitting: Obstetrics and Gynecology

## 2024-06-11 VITALS — BP 105/68 | HR 60 | Ht 67.0 in | Wt 191.0 lb

## 2024-06-11 DIAGNOSIS — Z3009 Encounter for other general counseling and advice on contraception: Secondary | ICD-10-CM

## 2024-06-11 NOTE — Progress Notes (Addendum)
 38 y.o. GYN presents for Surgical Consult.  BTL signed Today.

## 2024-06-11 NOTE — Progress Notes (Signed)
 38 yo P4 with LMP 05/10/24 and BMI 29 presenting for surgical consultation on sterilization procedure. Patient reports feeling well and is without any complaints. She has used the IUD in the past and desires permanent sterilization.  Past Medical History:  Diagnosis Date   Medical history non-contributory    Past Surgical History:  Procedure Laterality Date   NO PAST SURGERIES     History reviewed. No pertinent family history. Social History   Tobacco Use   Smoking status: Never   Smokeless tobacco: Never  Vaping Use   Vaping status: Never Used  Substance Use Topics   Alcohol use: Never   Drug use: Never   ROS See pertinent in HPI. All other systems reviewed and non contributory Blood pressure 105/68, pulse 60, height 5' 7 (1.702 m), weight 191 lb (86.6 kg), last menstrual period 05/10/2024, currently breastfeeding. GENERAL: Well-developed, well-nourished female in no acute distress.  NEURO: alert and oriented x 3  A/P 38 yo P4 here for sterilization consultation - Other reversible forms of contraception were discussed with patient; she declines all other modalities.   - Risks of procedure discussed with patient including permanence of method, risk of regret, bleeding, infection, injury to surrounding organs and need for additional procedures including laparotomy.  Failure risk less than 0.5% with increased risk of ectopic gestation if pregnancy occurs was also discussed with patient.   - Patient signed medicaid required form today - Patient will be scheduled for laparoscopic bilateral salpingectomy

## 2024-07-16 ENCOUNTER — Telehealth: Payer: Self-pay

## 2024-07-16 NOTE — Telephone Encounter (Signed)
 I called patient to schedule surgery w/ Dr. Alger. Patient phone was temporarily out of service. I also called patient's emergency contact asking that the patient call me back [phone].

## 2024-07-21 DIAGNOSIS — Z419 Encounter for procedure for purposes other than remedying health state, unspecified: Secondary | ICD-10-CM | POA: Diagnosis not present

## 2024-08-20 DIAGNOSIS — Z419 Encounter for procedure for purposes other than remedying health state, unspecified: Secondary | ICD-10-CM | POA: Diagnosis not present
